# Patient Record
Sex: Male | Born: 1985 | Smoking: Current some day smoker
Health system: Southern US, Community
[De-identification: ages and names within clinical notes are randomized; demographics above are authoritative.]

## PROBLEM LIST (undated history)

## (undated) ENCOUNTER — Ambulatory Visit (HOSPITAL_COMMUNITY): Admission: EM | Payer: Self-pay | Source: Home / Self Care

## (undated) DIAGNOSIS — G4730 Sleep apnea, unspecified: Secondary | ICD-10-CM

## (undated) DIAGNOSIS — I10 Essential (primary) hypertension: Secondary | ICD-10-CM

## (undated) DIAGNOSIS — M109 Gout, unspecified: Secondary | ICD-10-CM

## (undated) DIAGNOSIS — N159 Renal tubulo-interstitial disease, unspecified: Secondary | ICD-10-CM

## (undated) HISTORY — DX: Sleep apnea, unspecified: G47.30

## (undated) HISTORY — DX: Essential (primary) hypertension: I10

---

## 2011-12-24 ENCOUNTER — Ambulatory Visit: Payer: Self-pay

## 2011-12-24 DIAGNOSIS — Z111 Encounter for screening for respiratory tuberculosis: Secondary | ICD-10-CM

## 2013-11-05 ENCOUNTER — Encounter (HOSPITAL_COMMUNITY): Payer: Self-pay | Admitting: Emergency Medicine

## 2013-11-05 ENCOUNTER — Emergency Department (HOSPITAL_COMMUNITY)
Admission: EM | Admit: 2013-11-05 | Discharge: 2013-11-05 | Disposition: A | Discharge status: Home / Self Care | Payer: No Typology Code available for payment source | Attending: Emergency Medicine | Admitting: Emergency Medicine

## 2013-11-05 ENCOUNTER — Emergency Department (HOSPITAL_COMMUNITY): Payer: No Typology Code available for payment source

## 2013-11-05 DIAGNOSIS — M542 Cervicalgia: Secondary | ICD-10-CM

## 2013-11-05 DIAGNOSIS — Y9289 Other specified places as the place of occurrence of the external cause: Secondary | ICD-10-CM | POA: Insufficient documentation

## 2013-11-05 DIAGNOSIS — S199XXA Unspecified injury of neck, initial encounter: Secondary | ICD-10-CM

## 2013-11-05 DIAGNOSIS — M549 Dorsalgia, unspecified: Secondary | ICD-10-CM

## 2013-11-05 DIAGNOSIS — Y9389 Activity, other specified: Secondary | ICD-10-CM | POA: Insufficient documentation

## 2013-11-05 DIAGNOSIS — IMO0002 Reserved for concepts with insufficient information to code with codable children: Secondary | ICD-10-CM | POA: Insufficient documentation

## 2013-11-05 DIAGNOSIS — S0993XA Unspecified injury of face, initial encounter: Secondary | ICD-10-CM | POA: Insufficient documentation

## 2013-11-05 MED ORDER — CYCLOBENZAPRINE HCL 5 MG PO TABS: 5.0000 mg | ORAL_TABLET | Freq: Three times a day (TID) | ORAL | Status: DC | PRN

## 2013-11-05 MED ORDER — IBUPROFEN 800 MG PO TABS: 800.0000 mg | ORAL_TABLET | Freq: Three times a day (TID) | ORAL | Status: DC

## 2013-11-05 MED ORDER — IBUPROFEN 400 MG PO TABS
800.0000 mg | ORAL_TABLET | Freq: Once | ORAL | Status: AC
  Administered 2013-11-05: 800 mg via ORAL
  Filled 2013-11-05: qty 2
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
## 2013-11-09 NOTE — ED Provider Notes (Signed)
Medical screening examination/treatment/procedure(s) were performed by non-physician practitioner and as supervising physician I was immediately available for consultation/collaboration.  Christopher J. Pollina, MD 11/09/13 0758 

## 2015-01-12 IMAGING — CR DG LUMBAR SPINE COMPLETE 4+V
5 series · 5 of 5 positions shown · non-contrast
Comparison: None.

CLINICAL DATA: Pain post trauma

EXAM:
LUMBAR SPINE - COMPLETE 4+ VIEW

[t l-spine a.p.]
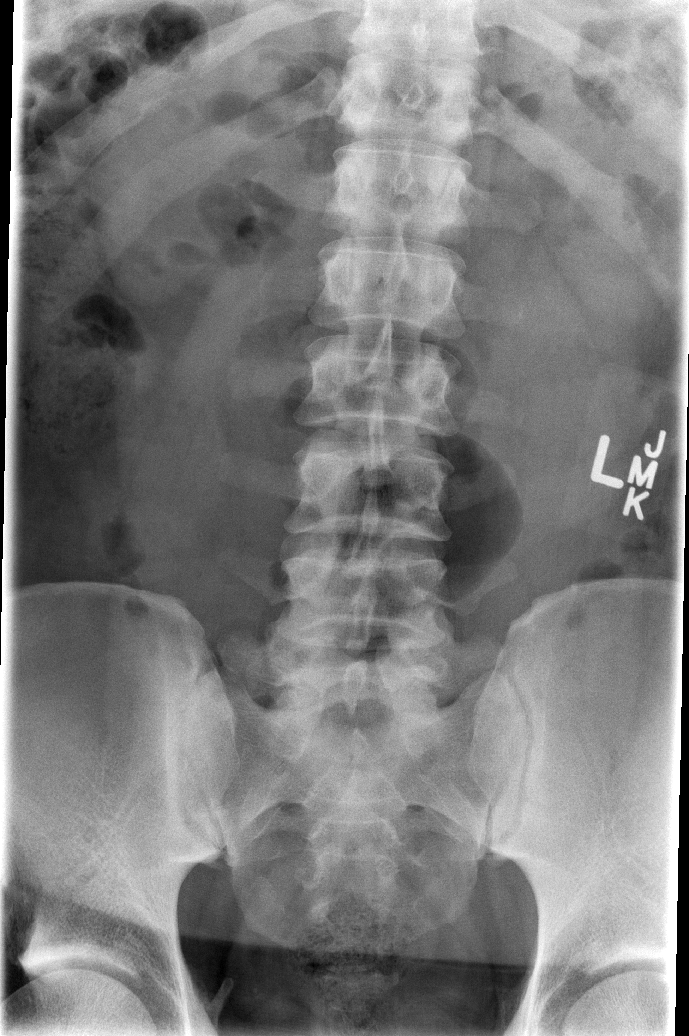

[t l-spine oblique exposure (1 of 2)]
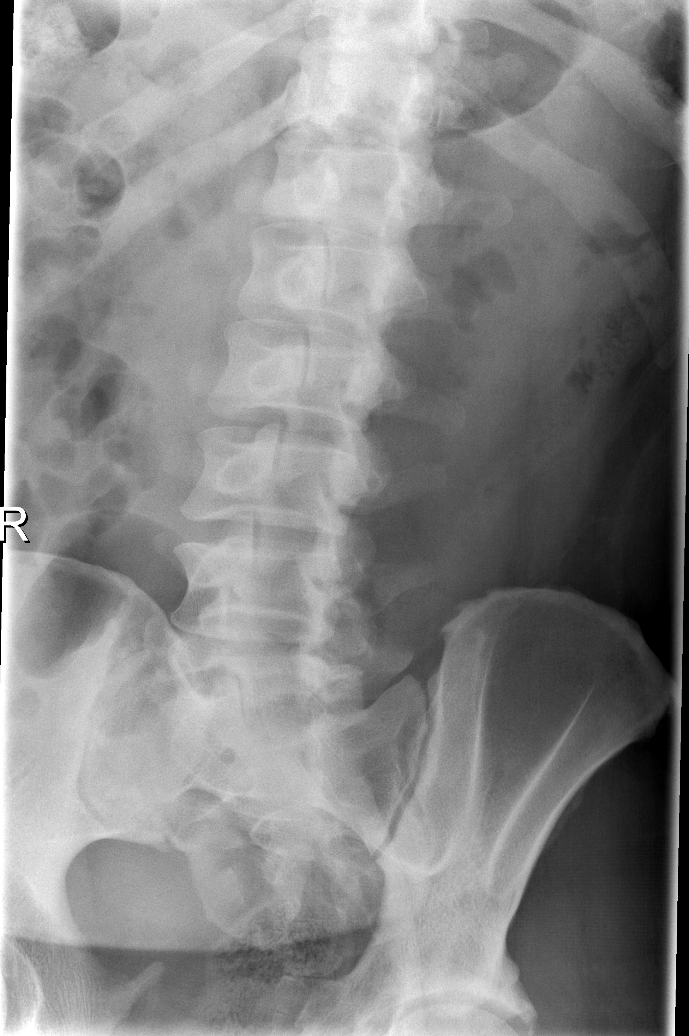

[t l-spine oblique exposure (2 of 2)]
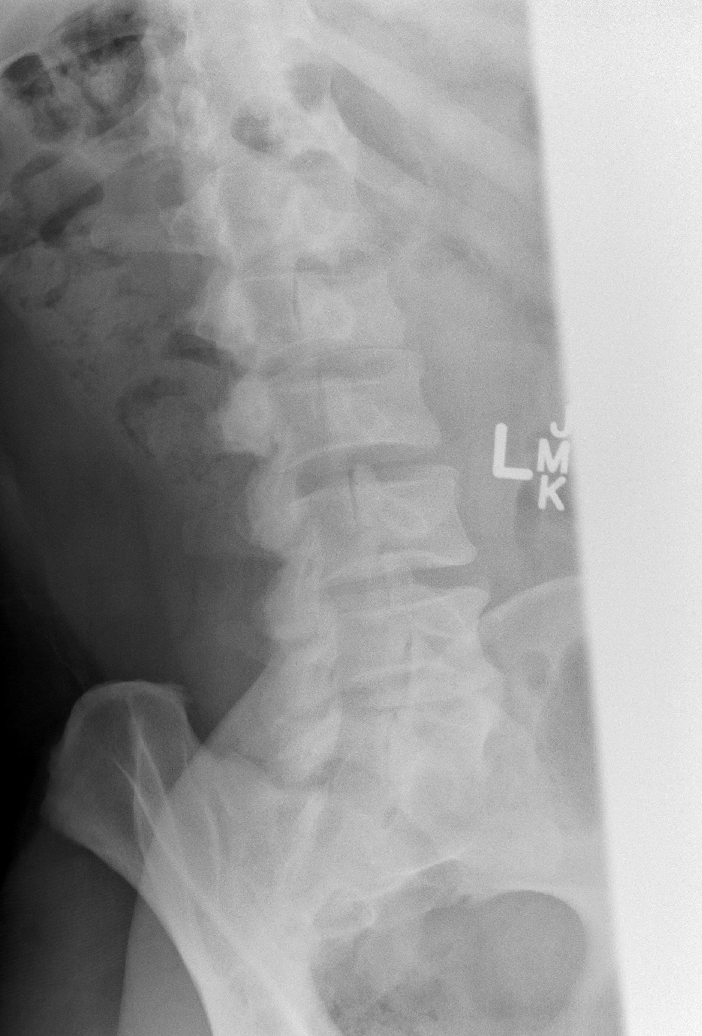

[t l-spine lat]
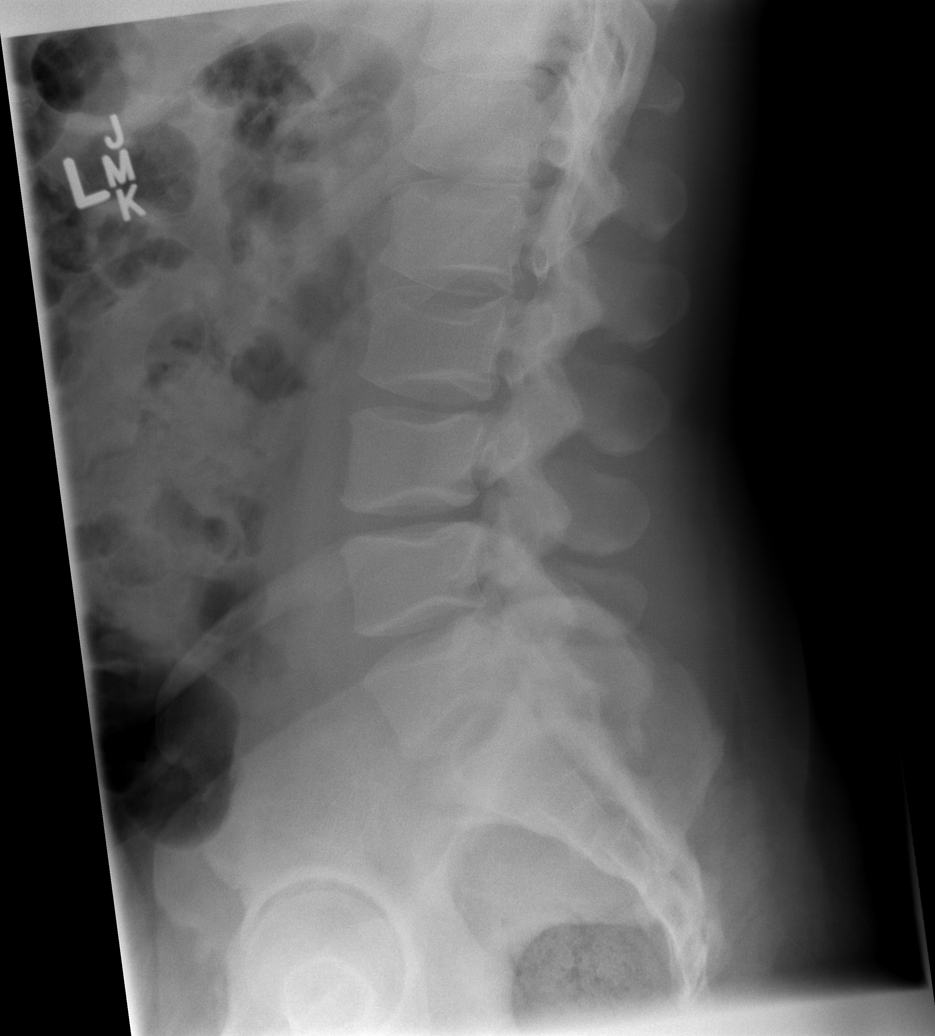

[t l-spine l5-s1 spot]
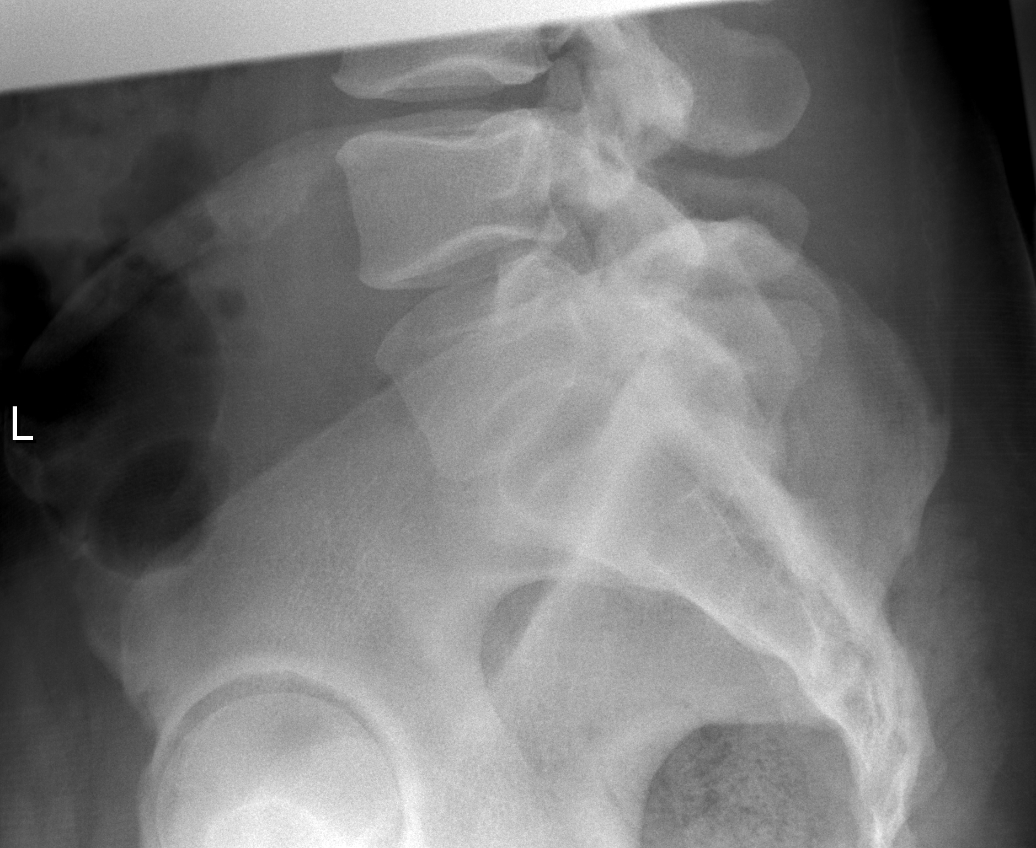

[5 of 5 positions shown; findings below may reference images not displayed]

FINDINGS: Frontal, lateral, spot lumbosacral lateral, and bilateral oblique
views were obtained. There are 6 non-rib-bearing lumbar type
vertebral bodies. There are multiple endplate defects which do not
appear acute. There is no acute appearing fracture or
spondylolisthesis. Disc spaces appear intact. There is no
appreciable facet arthropathy.
IMPRESSION: Multiple endplate defects. Question prior end plate infarcts. No
acute fracture or spondylolisthesis.
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
## 2017-02-11 ENCOUNTER — Ambulatory Visit: Payer: Self-pay | Admitting: Primary Care

## 2017-03-18 ENCOUNTER — Encounter (HOSPITAL_COMMUNITY): Payer: Self-pay

## 2017-03-18 ENCOUNTER — Ambulatory Visit (HOSPITAL_COMMUNITY)
Admission: EM | Admit: 2017-03-18 | Discharge: 2017-03-18 | Disposition: A | Discharge status: Home / Self Care | Payer: 59 | Attending: Internal Medicine | Admitting: Internal Medicine

## 2017-03-18 DIAGNOSIS — L03116 Cellulitis of left lower limb: Secondary | ICD-10-CM | POA: Diagnosis not present

## 2017-03-18 MED ORDER — SULFAMETHOXAZOLE-TRIMETHOPRIM 800-160 MG PO TABS: 1.0000 | ORAL_TABLET | Freq: Two times a day (BID) | ORAL | 0 refills | Status: AC

## 2017-03-18 MED ORDER — IBUPROFEN 800 MG PO TABS: 800.0000 mg | ORAL_TABLET | Freq: Three times a day (TID) | ORAL | 0 refills | Status: DC

## 2017-03-18 NOTE — ED Provider Notes (Signed)
CSN: [id number]     Arrival date & time 03/18/17  1952 History   First MD Initiated Contact with Patient 03/18/17 2054     Chief Complaint  Patient presents with  . Foot Pain   (Consider location/radiation/quality/duration/timing/severity/associated sxs/prior Treatment) Subjective:   Jose Caldwell is a 31 y.o. male who presents for evaluation of of left anterior foot pain. Onset of symptoms was gradual starting 3 days ago with a gradually worsening course since that time. Symptoms include erythema. Patient denies chills or fevers. He is able to ambulate but with some discomfort. There is not a history of trauma to the area. Treatment to date has included none  The following portions of the patient's history were reviewed and updated as appropriate: allergies, current medications, past family history, past medical history, past social history, past surgical history and problem list.         History reviewed. No pertinent past medical history. History reviewed. No pertinent surgical history. No family history on file. Social History  Substance Use Topics  . Smoking status: Never Smoker  . Smokeless tobacco: Never Used  . Alcohol use 1.2 oz/week    2 Standard drinks or equivalent per week    Review of Systems  Constitutional: Negative for fever.  Musculoskeletal:       Right foot redness, swelling and pain   All other systems reviewed and are negative.   Allergies  Patient has no known allergies.  Home Medications   Prior to Admission medications   Medication Sig Start Date End Date Taking? Authorizing Provider  ibuprofen (ADVIL,MOTRIN) 800 MG tablet Take 1 tablet (800 mg total) by mouth 3 (three) times daily. 03/18/17   Lurline Idol, FNP  sulfamethoxazole-trimethoprim (BACTRIM DS,SEPTRA DS) 800-160 MG tablet Take 1 tablet by mouth 2 (two) times daily. 03/18/17 03/23/17  Lurline Idol, FNP   Meds Ordered and Administered this Visit  Medications - No data to  display  BP 140/90 (BP Location: Right Arm)   Pulse (!) 101   Temp 98.4 F (36.9 C) (Oral)   Resp 20   SpO2 99%  No data found.   Physical Exam  Constitutional: He is oriented to person, place, and time. He appears well-developed and well-nourished. No distress.  Cardiovascular: Normal rate and regular rhythm.   Pulmonary/Chest: Effort normal and breath sounds normal.  Musculoskeletal: He exhibits tenderness. He exhibits no deformity.  Right anterior foot near base of great toe with redness, warmth and tenderness extending up the foot and out towards the base of the 2nd-4th toes without any streaking. Pulses intact. No great toe swelling or joint tenderness   Neurological: He is alert and oriented to person, place, and time.  Skin: Skin is warm and dry.  Psychiatric: He has a normal mood and affect.    Urgent Care Course     Procedures (including critical care time)  Labs Review Labs Reviewed - No data to display  Imaging Review No results found.   Visual Acuity Review  Right Eye Distance:   Left Eye Distance:   Bilateral Distance:    Right Eye Near:   Left Eye Near:    Bilateral Near:         MDM   1. Cellulitis of left lower extremity    Antibiotics given (bactrim BID x 5 days)  Ibuprofen 800 mg TID as needed for pain  Strict return precautions discussed  Follow-up as needed   Discussed diagnosis and treatment with patient. All questions have been  answered and all concerns have been addressed. The patient verbalized understanding and had no further questions      Lurline IdolMurrill, Lennard Capek, OregonFNP 03/18/17 2109

## 2017-03-18 NOTE — ED Triage Notes (Signed)
Pt said he woke up 3 days ago and said he was having mild pain and now today it's swollen and throbbing with extreme pain. Pt did mention he was diagnosed with gout a few years ago but never got the medication filled and never had a problem since then. No otc meds tried.

## 2017-11-23 DIAGNOSIS — Z719 Counseling, unspecified: Secondary | ICD-10-CM | POA: Diagnosis not present

## 2018-03-01 ENCOUNTER — Other Ambulatory Visit: Payer: Self-pay

## 2018-03-01 ENCOUNTER — Ambulatory Visit (HOSPITAL_COMMUNITY)
Admission: EM | Admit: 2018-03-01 | Discharge: 2018-03-01 | Disposition: A | Discharge status: Home / Self Care | Payer: 59 | Attending: Family Medicine | Admitting: Family Medicine

## 2018-03-01 ENCOUNTER — Encounter (HOSPITAL_COMMUNITY): Payer: Self-pay | Admitting: Emergency Medicine

## 2018-03-01 DIAGNOSIS — M25562 Pain in left knee: Secondary | ICD-10-CM

## 2018-03-01 DIAGNOSIS — M7652 Patellar tendinitis, left knee: Secondary | ICD-10-CM | POA: Diagnosis not present

## 2018-03-01 MED ORDER — HYDROCODONE-ACETAMINOPHEN 7.5-325 MG PO TABS: 1.0000 | ORAL_TABLET | Freq: Four times a day (QID) | ORAL | 0 refills | Status: DC | PRN

## 2018-03-01 MED ORDER — METHYLPREDNISOLONE 4 MG PO TBPK: ORAL_TABLET | ORAL | 0 refills | Status: DC

## 2018-03-01 MED ORDER — IBUPROFEN 800 MG PO TABS: 800.0000 mg | ORAL_TABLET | Freq: Three times a day (TID) | ORAL | 0 refills | Status: DC

## 2018-03-01 NOTE — ED Triage Notes (Signed)
Left knee started aching, pain has worsened since then.  While sitting, unable to extend left leg- too painful.  No known injury

## 2018-03-01 NOTE — Discharge Instructions (Addendum)
Ice 20 min, 2-3 X a day Rest Medrol dosepak as directed Take all of day one today Restrict ambulation to only as needed After prednisone may take ibuprofen Pain medicine as needed Caution drowsiness and constipation See ortho if not improving by Monday

## 2018-03-01 NOTE — ED Provider Notes (Signed)
[facility]    CSN: [id number] Arrival date & time: 03/01/18  1231     History   Chief Complaint Chief Complaint  Patient presents with  . Knee Pain    HPI Jose Caldwell is a 32 y.o. male.   HPI  Knee pain since Sunday Noted pain while driving - mild Worse when stood our of truck Monday was more severe By today cannot comfortably bear weight. Patient denies any accident injury or overuse. He did not do any new activities or kneeling. He is not athletic at this time although was in his youth. He is never had any problems.  Does not recall any x-rays. He took some over-the-counter medicines with no improvement.   History reviewed. No pertinent past medical history.  There are no active problems to display for this patient.   History reviewed. No pertinent surgical history.     Home Medications    Prior to Admission medications   Medication Sig Start Date End Date Taking? Authorizing Provider  HYDROcodone-acetaminophen (NORCO) 7.5-325 MG tablet Take 1 tablet by mouth every 6 (six) hours as needed for moderate pain. 03/01/18   Eustace Moore, MD  ibuprofen (ADVIL,MOTRIN) 800 MG tablet Take 1 tablet (800 mg total) by mouth 3 (three) times daily. 03/01/18   Eustace Moore, MD  methylPREDNISolone (MEDROL DOSEPAK) 4 MG TBPK tablet tad 03/01/18   Eustace Moore, MD    Family History Family History  Problem Relation Age of Onset  . Healthy Mother     Social History Social History   Tobacco Use  . Smoking status: Current Every Day Smoker    Types: Cigars  . Smokeless tobacco: Never Used  Substance Use Topics  . Alcohol use: Yes    Alcohol/week: 1.2 oz    Types: 2 Standard drinks or equivalent per week  . Drug use: Never     Allergies   Patient has no known allergies.   Review of Systems Review of Systems  Constitutional: Negative for chills and fever.  HENT: Negative for ear pain and sore throat.   Eyes: Negative for pain and  visual disturbance.  Respiratory: Negative for cough and shortness of breath.   Cardiovascular: Negative for chest pain and palpitations.  Gastrointestinal: Negative for abdominal pain and vomiting.  Genitourinary: Negative for dysuria and hematuria.  Musculoskeletal: Negative for arthralgias and back pain.  Skin: Negative for color change and rash.  Neurological: Negative for seizures and syncope.  All other systems reviewed and are negative.    Physical Exam Triage Vital Signs ED Triage Vitals  Enc Vitals Group     BP 03/01/18 1320 (!) 144/99     Pulse Rate 03/01/18 1320 89     Resp 03/01/18 1320 (!) 22     Temp 03/01/18 1320 98.7 F (37.1 C)     Temp Source 03/01/18 1320 Oral     SpO2 03/01/18 1320 100 %     Weight --      Height --      Head Circumference --      Peak Flow --      Pain Score 03/01/18 1317 9     Pain Loc --      Pain Edu? --      Excl. in GC? --    No data found.  Updated Vital Signs BP (!) 144/99 (BP Location: Right Arm) Comment (BP Location): large cuff  Pulse 89   Temp 98.7 F (37.1 C) (Oral)  Resp (!) 22   SpO2 100%       Physical Exam  Constitutional: He appears well-developed and well-nourished. He appears distressed.  Obvious pain  HENT:  Head: Normocephalic and atraumatic.  Eyes: Pupils are equal, round, and reactive to light. Conjunctivae are normal.  Neck: Normal range of motion. Neck supple.  Cardiovascular: Normal rate and regular rhythm.  No murmur heard. Pulmonary/Chest: Effort normal and breath sounds normal. No respiratory distress.  Abdominal: Soft. There is no tenderness.  Musculoskeletal: He exhibits no edema.       Right knee: Normal.       Left knee: He exhibits decreased range of motion, swelling and erythema. Tenderness found. Patellar tendon tenderness noted.  From a seated position the patient is able to extend his left leg no more than 6 inches.  I can passively extend leg. No joint line tenderness.  No  effusion.  No instability to examination. This redness warmth and tenderness from the upper pole of the patella down to the tibial tuberosity. No fluid in the prepatellar bursa  Neurological: He is alert.  Skin: Skin is warm and dry.  Psychiatric: He has a normal mood and affect.  Nursing note and vitals reviewed.    UC Treatments / Results  L  Initial Impression / Assessment and Plan / UC Course  I have reviewed the triage vital signs and the nursing notes.  Pertinent labs & imaging results that were available during my care of the patient were reviewed by me and considered in my medical decision making (see chart for details).     Discussed with patient that it does not appear that he has any internal derangement in his knee.  He has anterior knee pain from an inflammatory response in his patellar tendon.  He must have had unknown trauma or stress to this area.  X-rays would not be contributory.  Conservative care discussed. Final Clinical Impressions(s) / UC Diagnoses   Final diagnoses:  Patellar tendinitis of left knee  Knee pain, left anterior     Discharge Instructions     Ice 20 min, 2-3 X a day Rest Medrol dosepak as directed Take all of day one today Restrict ambulation to only as needed After prednisone may take ibuprofen Pain medicine as needed Caution drowsiness and constipation See ortho if not improving by Monday   ED Prescriptions    Medication Sig Dispense Auth. Provider   ibuprofen (ADVIL,MOTRIN) 800 MG tablet Take 1 tablet (800 mg total) by mouth 3 (three) times daily. 30 tablet Eustace Moore, MD   methylPREDNISolone (MEDROL DOSEPAK) 4 MG TBPK tablet tad 21 tablet Eustace Moore, MD   HYDROcodone-acetaminophen [facility]) 7.5-325 MG tablet Take 1 tablet by mouth every 6 (six) hours as needed for moderate pain. 15 tablet Eustace Moore, MD     Controlled Substance Prescriptions [place] Controlled Substance Registry consulted? No prior narcotics    Eustace Moore, MD 03/01/18 [phone]

## 2018-03-15 ENCOUNTER — Ambulatory Visit (HOSPITAL_COMMUNITY)
Admission: EM | Admit: 2018-03-15 | Discharge: 2018-03-15 | Disposition: A | Discharge status: Home / Self Care | Payer: 59 | Attending: Family Medicine | Admitting: Family Medicine

## 2018-03-15 ENCOUNTER — Encounter (HOSPITAL_COMMUNITY): Payer: Self-pay | Admitting: Emergency Medicine

## 2018-03-15 ENCOUNTER — Other Ambulatory Visit: Payer: Self-pay

## 2018-03-15 DIAGNOSIS — M109 Gout, unspecified: Secondary | ICD-10-CM | POA: Diagnosis not present

## 2018-03-15 MED ORDER — COLCHICINE 0.6 MG PO TABS: ORAL_TABLET | ORAL | 0 refills | Status: DC

## 2018-03-15 NOTE — ED Triage Notes (Signed)
The patient presented to the [facility] with a complaint of left foot pain. The patient denied any injury but reported a hx of gout.

## 2018-03-15 NOTE — ED Provider Notes (Signed)
[facility]   [id number] 03/15/18 Arrival Time: 1106  ASSESSMENT & PLAN:  1. Acute gout involving toe of left foot, unspecified cause    Meds ordered this encounter  Medications  . colchicine 0.6 MG tablet    Sig: Take two tablets as one dose followed one hour later by one tablet. No more than 3 tablets every 24 hours.    Dispense:  6 tablet    Refill:  0   Ibuprofen with food as needed. Will f/u here if not improving over the next couple of days.  Reviewed expectations re: course of current medical issues. Questions answered. Outlined signs and symptoms indicating need for more acute intervention. Patient verbalized understanding. After Visit Summary given.  SUBJECTIVE: History from: patient. Jose Caldwell is a 32 y.o. male who reports persistent moderate pain of his left first MTP joint/toe that is gradually worsening; described as aching and stabbing without radiation. Onset: abrupt, several days ago. Injury/trama: no. Relieved by: nothing in particular. Worsened by: certain movements. Associated symptoms: none reported. Extremity sensation changes or weakness: none. Self treatment: ibuprofen with only mild help. History of similar: yes, feels like gout he has been diagnosed with in the past. No new medications reported.  ROS: As per HPI.   OBJECTIVE:  Vitals:   03/15/18 1114  BP: 132/83  Pulse: (!) 104  Resp: 20  Temp: 97.7 F (36.5 C)  TempSrc: Oral  SpO2: 100%    General appearance: alert; no distress Extremities: warm and well perfused; symmetrical with no gross deformities; localized tenderness over his left first MTP joint with mild swelling and no bruising; mild erythema; ROM: normal but with pain reported CV: normal extremity capillary refill Skin: warm and dry Neurologic: normal gait; normal symmetric reflexes in all extremities; normal sensation in all extremities Psychological: alert and cooperative; normal mood and affect  No Known  Allergies   Social History   Socioeconomic History  . Marital status: Unknown    Spouse name: Not on file  . Number of children: Not on file  . Years of education: Not on file  . Highest education level: Not on file  Occupational History  . Not on file  Social Needs  . Financial resource strain: Not on file  . Food insecurity:    Worry: Not on file    Inability: Not on file  . Transportation needs:    Medical: Not on file    Non-medical: Not on file  Tobacco Use  . Smoking status: Current Every Day Smoker    Types: Cigars  . Smokeless tobacco: Never Used  Substance and Sexual Activity  . Alcohol use: Yes    Alcohol/week: 1.2 oz    Types: 2 Standard drinks or equivalent per week  . Drug use: Never  . Sexual activity: Not on file  Lifestyle  . Physical activity:    Days per week: Not on file    Minutes per session: Not on file  . Stress: Not on file  Relationships  . Social connections:    Talks on phone: Not on file    Gets together: Not on file    Attends religious service: Not on file    Active member of club or organization: Not on file    Attends meetings of clubs or organizations: Not on file    Relationship status: Not on file  . Intimate partner violence:    Fear of current or ex partner: Not on file    Emotionally abused:  Not on file    Physically abused: Not on file    Forced sexual activity: Not on file  Other Topics Concern  . Not on file  Social History Narrative  . Not on file   Family History  Problem Relation Age of Onset  . Healthy Mother    History reviewed. No pertinent surgical history.    Mardella Layman, MD 03/15/18 1209

## 2018-09-15 ENCOUNTER — Ambulatory Visit (HOSPITAL_COMMUNITY)
Admission: EM | Admit: 2018-09-15 | Discharge: 2018-09-15 | Disposition: A | Discharge status: Home / Self Care | Payer: 59 | Attending: Family Medicine | Admitting: Family Medicine

## 2018-09-15 ENCOUNTER — Encounter (HOSPITAL_COMMUNITY): Payer: Self-pay | Admitting: Emergency Medicine

## 2018-09-15 DIAGNOSIS — M109 Gout, unspecified: Secondary | ICD-10-CM

## 2018-09-15 HISTORY — DX: Gout, unspecified: M10.9

## 2018-09-15 MED ORDER — HYDROCODONE-ACETAMINOPHEN 7.5-325 MG PO TABS: 1.0000 | ORAL_TABLET | Freq: Four times a day (QID) | ORAL | 0 refills | Status: DC | PRN

## 2018-09-15 MED ORDER — COLCHICINE 0.6 MG PO TABS: ORAL_TABLET | ORAL | 3 refills | Status: DC

## 2018-09-15 NOTE — Discharge Instructions (Signed)
Take the colchicine as directed I have given you refills Repeat at first sign of gout Take pain medicine as needed Do not drive on the [place]

## 2018-09-15 NOTE — ED Triage Notes (Signed)
Pt c/o Gout in his R foot, states hes ran out of his gout medicine and needs a refill.

## 2018-09-15 NOTE — ED Provider Notes (Signed)
[facility]    CSN: [id number] Arrival date & time: 09/15/18  [id number]     History   Chief Complaint Chief Complaint  Patient presents with  . Gout  . Foot Pain    HPI Jose Caldwell is a 32 y.o. male.   HPI  Patient has recurrence of gout in his right foot.  He has had gout before.  He recognizes signs.  He knows that diet.  He does not drink alcohol.  He tries to keep up with his fluids.  He does not have a PCP.  He does not have a way to get his medicine refilled.  He is here today requesting colchicine.  He has not had any trauma. We did discuss that it is preferable to see PCP on a regular basis.  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  Past Medical History:  Diagnosis Date  . Gout     There are no active problems to display for this patient.   History reviewed. No pertinent surgical history.     Home Medications    Prior to Admission medications   Medication Sig Start Date End Date Taking? Authorizing Provider  colchicine 0.6 MG tablet Take two tablets as one dose followed one hour later by one tablet. No more than 3 tablets every 24 hours. 09/15/18   Eustace MooreNelson, Kito Cuffe Sue, MD  HYDROcodone-acetaminophen (NORCO) 7.5-325 MG tablet Take 1 tablet by mouth every 6 (six) hours as needed for moderate pain. 09/15/18   Eustace MooreNelson, Mehreen Azizi Sue, MD  ibuprofen (ADVIL,MOTRIN) 800 MG tablet Take 1 tablet (800 mg total) by mouth 3 (three) times daily. 03/01/18   Eustace MooreNelson, Noam Karaffa Sue, MD    Family History Family History  Problem Relation Age of Onset  . Healthy Mother     Social History Social History   Tobacco Use  . Smoking status: Current Every Day Smoker    Types: Cigars  . Smokeless tobacco: Never Used  Substance Use Topics  . Alcohol use: Yes    Alcohol/week: 2.0 standard drinks    Types: 2 Standard drinks or equivalent per week  . Drug use: Never     Allergies   Patient has no known allergies.   Review of Systems Review of Systems    Constitutional: Negative for chills and fever.  HENT: Negative for ear pain and sore throat.   Eyes: Negative for pain and visual disturbance.  Respiratory: Negative for cough and shortness of breath.   Cardiovascular: Negative for chest pain and palpitations.  Gastrointestinal: Negative for abdominal pain and vomiting.  Genitourinary: Negative for dysuria and hematuria.  Musculoskeletal: Positive for arthralgias and gait problem. Negative for back pain.  Skin: Negative for color change and rash.  Neurological: Negative for seizures and syncope.  All other systems reviewed and are negative.    Physical Exam Triage Vital Signs ED Triage Vitals [09/15/18 1044]  Enc Vitals Group     BP 120/68     Pulse Rate 77     Resp 16     Temp 98 F (36.7 C)     Temp src      SpO2 100 %     Weight      Height      Head Circumference      Peak Flow      Pain Score 7     Pain Loc      Pain Edu?      Excl. in GC?  No data found.  Updated Vital Signs BP 120/68   Pulse 77   Temp 98 F (36.7 C)   Resp 16   SpO2 100%   Visual Acuity Right Eye Distance:   Left Eye Distance:   Bilateral Distance:    Right Eye Near:   Left Eye Near:    Bilateral Near:     Physical Exam  Constitutional: He appears well-developed and well-nourished. No distress.  HENT:  Head: Normocephalic and atraumatic.  Mouth/Throat: Oropharynx is clear and moist.  Eyes: Pupils are equal, round, and reactive to light. Conjunctivae are normal.  Neck: Normal range of motion.  Cardiovascular: Normal rate, regular rhythm and normal heart sounds.  Pulmonary/Chest: Effort normal and breath sounds normal. No respiratory distress.  Abdominal: Soft. He exhibits no distension.  Musculoskeletal: Normal range of motion. He exhibits no edema.  Great toe on the right foot has redness, swelling, tenderness, warmth around the MTP joint  Neurological: He is alert.  Skin: Skin is warm and dry.  Psychiatric: He has a  normal mood and affect. His behavior is normal.     UC Treatments / Results  Labs (all labs ordered are listed, but only abnormal results are displayed) Labs Reviewed - No data to display  EKG None  Radiology No results found.  Procedures Procedures (including critical care time)  Medications Ordered in UC Medications - No data to display  Initial Impression / Assessment and Plan / UC Course  I have reviewed the triage vital signs and the nursing notes.  Pertinent labs & imaging results that were available during my care of the patient were reviewed by me and considered in my medical decision making (see chart for details).      Final Clinical Impressions(s) / UC Diagnoses   Final diagnoses:  Acute gout involving toe of right foot, unspecified cause     Discharge Instructions     Take the colchicine as directed I have given you refills Repeat at first sign of gout Take pain medicine as needed Do not drive on the [place]   ED Prescriptions    Medication Sig Dispense Auth. Provider   colchicine 0.6 MG tablet Take two tablets as one dose followed one hour later by one tablet. No more than 3 tablets every 24 hours. 6 tablet Eustace Moore, MD   HYDROcodone-acetaminophen [facility]) 7.5-325 MG tablet Take 1 tablet by mouth every 6 (six) hours as needed for moderate pain. 10 tablet Eustace Moore, MD     Controlled Substance Prescriptions [place] Controlled Substance Registry consulted? Yes, I have consulted the  Controlled Substances Registry for this patient, and feel the risk/benefit ratio today is favorable for proceeding with this prescription for a controlled substance.   Eustace Moore, MD 09/15/18 [phone]

## 2019-06-25 ENCOUNTER — Encounter (HOSPITAL_COMMUNITY): Payer: Self-pay

## 2019-06-25 ENCOUNTER — Other Ambulatory Visit: Payer: Self-pay

## 2019-06-25 ENCOUNTER — Ambulatory Visit (HOSPITAL_COMMUNITY)
Admission: EM | Admit: 2019-06-25 | Discharge: 2019-06-25 | Disposition: A | Discharge status: Home / Self Care | Payer: 59 | Attending: Family Medicine | Admitting: Family Medicine

## 2019-06-25 DIAGNOSIS — G5621 Lesion of ulnar nerve, right upper limb: Secondary | ICD-10-CM | POA: Diagnosis not present

## 2019-06-25 LAB — GLUCOSE, CAPILLARY: Glucose-Capillary: 78 mg/dL (ref 70–99)

## 2019-06-25 MED ORDER — IBUPROFEN 800 MG PO TABS: 800.0000 mg | ORAL_TABLET | Freq: Three times a day (TID) | ORAL | 0 refills | Status: DC

## 2019-06-25 MED ORDER — PREDNISONE 10 MG (21) PO TBPK: ORAL_TABLET | Freq: Every day | ORAL | 0 refills | Status: DC

## 2019-06-25 NOTE — ED Triage Notes (Signed)
Patient presents to Urgent Care with complaints of medial right hand numbness  And intermittent pai since 5 days ago. 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

## 2019-06-25 NOTE — ED Provider Notes (Addendum)
[facility]    CSN: [id number] Arrival date & time: 06/25/19  1020      History   Chief Complaint Chief Complaint  Patient presents with  . hand numbness  . Gout    HPI Jose Caldwell is a 33 y.o. male.   HPI  he is a healthy 33 year old.  Rarely has gout.  Is here today for hand numbness.  He has some numbness in the right hand middle ring and small fingers.  Initially would come and go.  He states that it would at times be present and at other times it would just go away spontaneously.  He states that for the last 5 days it is been present all the time.  He states that initially it was a minor tingling.  Over time it is become more densely numb.  He can move the fingers normally.  He can move his hand normally.  No difficulty with driving.  No pain at night.  No injury or pain to the neck.  No injury or pain to the elbow or arm.  Good grip strength.  No other areas of numbness.  No history of diabetes.  Past Medical History:  Diagnosis Date  . Gout     There are no active problems to display for this patient.   History reviewed. No pertinent surgical history.     Home Medications    Prior to Admission medications   Medication Sig Start Date End Date Taking? Authorizing Provider  colchicine 0.6 MG tablet Take two tablets as one dose followed one hour later by one tablet. No more than 3 tablets every 24 hours. 09/15/18   Eustace MooreNelson, Yarithza Mink Sue, MD  ibuprofen (ADVIL) 800 MG tablet Take 1 tablet (800 mg total) by mouth 3 (three) times daily. 06/25/19   Eustace MooreNelson, Artemisa Sladek Sue, MD  predniSONE (STERAPRED UNI-PAK 21 TAB) 10 MG (21) TBPK tablet Take by mouth daily. Take 6 tabs by mouth daily  for 2 days, then 5 tabs for 2 days, then 4 tabs for 2 days, then 3 tabs for 2 days, 2 tabs for 2 days, then 1 tab by mouth daily for 2 days 06/25/19   Eustace MooreNelson, Juliah Scadden Sue, MD    Family History Family History  Problem Relation Age of Onset  . Healthy Mother   . Healthy Father      Social History Social History   Tobacco Use  . Smoking status: Current Every Day Smoker    Types: Cigars  . Smokeless tobacco: Never Used  Substance Use Topics  . Alcohol use: Yes    Alcohol/week: 2.0 standard drinks    Types: 2 Standard drinks or equivalent per week  . Drug use: Never     Allergies   Patient has no known allergies.   Review of Systems Review of Systems  Constitutional: Negative for chills and fever.  HENT: Negative for ear pain and sore throat.   Eyes: Negative for pain and visual disturbance.  Respiratory: Negative for cough and shortness of breath.   Cardiovascular: Negative for chest pain and palpitations.  Gastrointestinal: Negative for abdominal pain and vomiting.  Genitourinary: Negative for dysuria and hematuria.  Musculoskeletal: Negative for arthralgias and back pain.  Skin: Negative for color change and rash.  Neurological: Positive for numbness. Negative for seizures and syncope.  All other systems reviewed and are negative.    Physical Exam Triage Vital Signs ED Triage Vitals  Enc Vitals Group     BP 06/25/19 1143 Marland Kitchen(!)  132/96     Pulse Rate 06/25/19 1143 72     Resp 06/25/19 1143 15     Temp 06/25/19 1143 98.2 F (36.8 C)     Temp Source 06/25/19 1143 Temporal     SpO2 06/25/19 1143 100 %     Weight --      Height --      Head Circumference --      Peak Flow --      Pain Score 06/25/19 1141 0     Pain Loc --      Pain Edu? --      Excl. in [place]? --    No data found.  Updated Vital Signs BP (!) 132/96 (BP Location: Left Arm)   Pulse 72   Temp 98.2 F (36.8 C) (Temporal)   Resp 15   SpO2 100%      Physical Exam Constitutional:      General: He is not in acute distress.    Appearance: He is well-developed. He is obese.  HENT:     Head: Normocephalic and atraumatic.  Eyes:     Conjunctiva/sclera: Conjunctivae normal.     Pupils: Pupils are equal, round, and reactive to light.  Neck:     Musculoskeletal: Normal range  of motion.     Comments: Neck is full range of motion.  No tenderness.  Negative Spurling's Cardiovascular:     Rate and Rhythm: Normal rate.  Pulmonary:     Effort: Pulmonary effort is normal. No respiratory distress.  Abdominal:     General: There is no distension.     Palpations: Abdomen is soft.  Musculoskeletal: Normal range of motion.     Comments: Both upper extremities have normal strength sensation range of motion and reflexes.  There is no tenderness to palpation of the cubital tunnel  Skin:    General: Skin is warm and dry.  Neurological:     Mental Status: He is alert.  Psychiatric:        Mood and Affect: Mood normal.        Behavior: Behavior normal.      UC Treatments / Results  Labs (all labs ordered are listed, but only abnormal results are displayed) Labs Reviewed  GLUCOSE, CAPILLARY  CBG MONITORING, ED    EKG   Radiology No results found.  Procedures Procedures (including critical care time)  Medications Ordered in UC Medications - No data to display  Initial Impression / Assessment and Plan / UC Course  I have reviewed the triage vital signs and the nursing notes.  Pertinent labs & imaging results that were available during my care of the patient were reviewed by me and considered in my medical decision making (see chart for details).     Discussed that he had some sort of nerve impingement syndrome.  Probably elbow.  Possibly neck.  Number to treat him with anti-inflammatories.  If this worsens or fails to improve he will need to see his primary care doctor for referral to orthopedics Final Clinical Impressions(s) / UC Diagnoses   Final diagnoses:  Cubital tunnel syndrome on right     Discharge Instructions     Take the prednisone pack as directed.  Take all of day 1 today.  Tomorrow take it on schedule When you are all done with the prednisone you may start ibuprofen 3 times a day with food.  This is also an anti-inflammatory. I have  refilled your gout medicine.  Take it as  needed. Will be good for you to have a primary care doctor for your ongoing medical needs.  I have given you the name of one taking new patients   ED Prescriptions    Medication Sig Dispense Auth. Provider   predniSONE (STERAPRED UNI-PAK 21 TAB) 10 MG (21) TBPK tablet Take by mouth daily. Take 6 tabs by mouth daily  for 2 days, then 5 tabs for 2 days, then 4 tabs for 2 days, then 3 tabs for 2 days, 2 tabs for 2 days, then 1 tab by mouth daily for 2 days 42 tablet Eustace MooreNelson, Malori Myers Sue, MD   ibuprofen (ADVIL) 800 MG tablet Take 1 tablet (800 mg total) by mouth 3 (three) times daily. 30 tablet Eustace MooreNelson, Wilfrido Luedke Sue, MD     Controlled Substance Prescriptions [place] Controlled Substance Registry consulted? No   Eustace MooreNelson, Kaymarie Wynn Sue, MD 06/25/19 1345    Eustace MooreNelson, Jacksyn Beeks Sue, MD 06/25/19 1346

## 2019-06-25 NOTE — Discharge Instructions (Signed)
Take the prednisone pack as directed.  Take all of day 1 today.  Tomorrow take it on schedule When you are all done with the prednisone you may start ibuprofen 3 times a day with food.  This is also an anti-inflammatory. I have refilled your gout medicine.  Take it as needed. Will be good for you to have a primary care doctor for your ongoing medical needs.  I have given you the name of one taking new patients

## 2020-01-28 ENCOUNTER — Ambulatory Visit: Payer: 59 | Attending: Internal Medicine

## 2021-02-27 ENCOUNTER — Encounter: Payer: Self-pay | Admitting: Emergency Medicine

## 2021-02-27 ENCOUNTER — Ambulatory Visit
Admission: EM | Admit: 2021-02-27 | Discharge: 2021-02-27 | Disposition: A | Discharge status: Home / Self Care | Payer: 59 | Attending: Emergency Medicine | Admitting: Emergency Medicine

## 2021-02-27 ENCOUNTER — Other Ambulatory Visit: Payer: Self-pay

## 2021-02-27 DIAGNOSIS — R200 Anesthesia of skin: Secondary | ICD-10-CM | POA: Diagnosis not present

## 2021-02-27 MED ORDER — TIZANIDINE HCL 2 MG PO TABS: 2.0000 mg | ORAL_TABLET | Freq: Four times a day (QID) | ORAL | 0 refills | Status: DC | PRN

## 2021-02-27 MED ORDER — PREDNISONE 10 MG PO TABS: ORAL_TABLET | ORAL | 0 refills | Status: DC

## 2021-02-27 NOTE — ED Provider Notes (Signed)
[facility]    CSN: [id number] Arrival date & time: 02/27/21  1248      History   Chief Complaint Chief Complaint  Patient presents with  . Numbness    HPI Jose Caldwell is a 35 y.o. male presenting today for evaluation of index finger numbness.  Reports had a fall approximately 1 week ago sustaining abrasions underneath his fourth and fifth fingers on his left hand.  Denies any pain or limitations in movement after the fall.  Over the past few days has developed numbness to his left index finger.  Reports numbness is on palmar surface as well as dorsum of hand, denies limitation of motion or any pain.  Reports numbness tingling sensation.  Also reports he cracked his neck a few days ago which he is concerned may be contributing to his numbness sensation as well.  Denies numbness tingling throughout his arm, will occasionally have some discomfort in her shoulder with lying on it at night.  HPI  Past Medical History:  Diagnosis Date  . Gout     There are no problems to display for this patient.   History reviewed. No pertinent surgical history.     Home Medications    Prior to Admission medications   Medication Sig Start Date End Date Taking? Authorizing Provider  predniSONE (DELTASONE) 10 MG tablet Begin with 6 tabs on day 1, 5 tab on day 2, 4 tab on day 3, 3 tab on day 4, 2 tab on day 5, 1 tab on day 6-take with food 02/27/21  Yes Adrin Julian C, PA-C  tiZANidine (ZANAFLEX) 2 MG tablet Take 1-2 tablets (2-4 mg total) by mouth every 6 (six) hours as needed for muscle spasms. 02/27/21  Yes Christpoher Sievers C, PA-C  colchicine 0.6 MG tablet Take two tablets as one dose followed one hour later by one tablet. No more than 3 tablets every 24 hours. 09/15/18 02/27/21  Eustace Moore, MD    Family History Family History  Problem Relation Age of Onset  . Healthy Mother   . Healthy Father     Social History Social History   Tobacco Use  . Smoking status:  Current Some Day Smoker    Types: Cigars  . Smokeless tobacco: Never Used  Vaping Use  . Vaping Use: Never used  Substance Use Topics  . Alcohol use: Yes    Alcohol/week: 2.0 standard drinks    Types: 2 Standard drinks or equivalent per week  . Drug use: Never     Allergies   Patient has no known allergies.   Review of Systems Review of Systems  Constitutional: Negative for fatigue and fever.  Eyes: Negative for redness, itching and visual disturbance.  Respiratory: Negative for shortness of breath.   Cardiovascular: Negative for chest pain and leg swelling.  Gastrointestinal: Negative for nausea and vomiting.  Musculoskeletal: Negative for arthralgias and myalgias.  Skin: Negative for color change, rash and wound.  Neurological: Positive for numbness. Negative for dizziness, syncope, weakness, light-headedness and headaches.     Physical Exam Triage Vital Signs ED Triage Vitals  Enc Vitals Group     BP      Pulse      Resp      Temp      Temp src      SpO2      Weight      Height      Head Circumference      Peak Flow  Pain Score      Pain Loc      Pain Edu?      Excl. in GC?    No data found.  Updated Vital Signs BP (!) 145/98 (BP Location: Left Arm)   Pulse 89   Temp 98.3 F (36.8 C) (Oral)   Resp 13   Ht 6\' 3"  (1.905 m)   Wt (!) 322 lb (146.1 kg)   SpO2 98%   BMI 40.25 kg/m   Visual Acuity Right Eye Distance:   Left Eye Distance:   Bilateral Distance:    Right Eye Near:   Left Eye Near:    Bilateral Near:     Physical Exam Vitals and nursing note reviewed.  Constitutional:      Appearance: He is well-developed.     Comments: No acute distress  HENT:     Head: Normocephalic and atraumatic.     Nose: Nose normal.  Eyes:     Conjunctiva/sclera: Conjunctivae normal.  Cardiovascular:     Rate and Rhythm: Normal rate.  Pulmonary:     Effort: Pulmonary effort is normal. No respiratory distress.  Abdominal:     General: There is  no distension.  Musculoskeletal:        General: Normal range of motion.     Cervical back: Neck supple.     Comments: Neck: Nontender to palpation along cervical spine midline, full active range of motion  Full active range of motion of left shoulder elbow and wrist, nontender to palpation throughout the arm  Left index finger without any swelling erythema or discoloration, nontender to palpation throughout finger, full active range of motion, strength intact  Radial pulse 2+  Skin:    General: Skin is warm and dry.  Neurological:     Mental Status: He is alert and oriented to person, place, and time.      UC Treatments / Results  Labs (all labs ordered are listed, but only abnormal results are displayed) Labs Reviewed - No data to display  EKG   Radiology No results found.  Procedures Procedures (including critical care time)  Medications Ordered in UC Medications - No data to display  Initial Impression / Assessment and Plan / UC Course  I have reviewed the triage vital signs and the nursing notes.  Pertinent labs & imaging results that were available during my care of the patient were reviewed by me and considered in my medical decision making (see chart for details).     Unclear etiology of paresthesias of left index finger, low suspicion of underlying acute bony abnormality, deferring imaging.  Possible nerve irritation stemming from neck of the neck connecting pain or paresthesias.  Placing on prednisone course x6 days encouraged to follow-up with sports medicine if persisting.  No neuro deficits, strength intact, full active range of motion.  Discussed strict return precautions. Patient verbalized understanding and is agreeable with plan.  Final Clinical Impressions(s) / UC Diagnoses   Final diagnoses:  Finger numbness     Discharge Instructions     Begin prednisone taper x6 days-begin with 6 tablets on day 1, decrease by 1 tablet each day until  complete-6, 5, 4, 3, 2, 1-take with food and earlier in the day if possible  May supplement with tizanidine at home/bedtime for any neck/shoulder/back discomfort-this may cause drowsiness, do not drive or work after taking  Follow-up with sports medicine if symptoms persist    ED Prescriptions    Medication Sig Dispense Auth. Provider  predniSONE (DELTASONE) 10 MG tablet Begin with 6 tabs on day 1, 5 tab on day 2, 4 tab on day 3, 3 tab on day 4, 2 tab on day 5, 1 tab on day 6-take with food 21 tablet Thurlow Gallaga C, PA-C   tiZANidine (ZANAFLEX) 2 MG tablet Take 1-2 tablets (2-4 mg total) by mouth every 6 (six) hours as needed for muscle spasms. 30 tablet Jaeven Wanzer, [place] C, PA-C     PDMP not reviewed this encounter.   Lew Dawes, [place] 02/27/21 1333

## 2021-02-27 NOTE — Discharge Instructions (Signed)
Begin prednisone taper x6 days-begin with 6 tablets on day 1, decrease by 1 tablet each day until complete-6, 5, 4, 3, 2, 1-take with food and earlier in the day if possible  May supplement with tizanidine at home/bedtime for any neck/shoulder/back discomfort-this may cause drowsiness, do not drive or work after taking  Follow-up with sports medicine if symptoms persist

## 2021-02-27 NOTE — ED Triage Notes (Addendum)
Patient c/o LFT hand (index finger) numbness x 8 days.   Patient endorse having a fall a week and a half ago upon onset of symptoms. Patient states " I fell onto my hand on the concrete".   Patient endorses normal ROM.   Patient endorses numbness in finger worsens at night.   Patient denies any swelling.   Patient hasn't used any medications for symptoms.

## 2021-05-14 ENCOUNTER — Encounter (HOSPITAL_COMMUNITY): Payer: Self-pay | Admitting: Emergency Medicine

## 2021-05-14 ENCOUNTER — Emergency Department (HOSPITAL_COMMUNITY)
Admission: EM | Admit: 2021-05-14 | Discharge: 2021-05-14 | Disposition: A | Discharge status: Home / Self Care | Payer: 59 | Attending: Emergency Medicine | Admitting: Emergency Medicine

## 2021-05-14 ENCOUNTER — Emergency Department (HOSPITAL_COMMUNITY): Payer: 59

## 2021-05-14 DIAGNOSIS — R0789 Other chest pain: Secondary | ICD-10-CM | POA: Diagnosis not present

## 2021-05-14 DIAGNOSIS — M10072 Idiopathic gout, left ankle and foot: Secondary | ICD-10-CM | POA: Insufficient documentation

## 2021-05-14 DIAGNOSIS — R002 Palpitations: Secondary | ICD-10-CM | POA: Insufficient documentation

## 2021-05-14 DIAGNOSIS — M109 Gout, unspecified: Secondary | ICD-10-CM

## 2021-05-14 DIAGNOSIS — M545 Low back pain, unspecified: Secondary | ICD-10-CM | POA: Diagnosis not present

## 2021-05-14 DIAGNOSIS — F17290 Nicotine dependence, other tobacco product, uncomplicated: Secondary | ICD-10-CM | POA: Insufficient documentation

## 2021-05-14 LAB — BASIC METABOLIC PANEL
Anion gap: 10 (ref 5–15)
BUN: 7 mg/dL (ref 6–20)
CO2: 25 mmol/L (ref 22–32)
Calcium: 9.4 mg/dL (ref 8.9–10.3)
Chloride: 102 mmol/L (ref 98–111)
Creatinine, Ser: 1.06 mg/dL (ref 0.61–1.24)
GFR, Estimated: >60 mL/min (ref >60)
Glucose, Bld: 87 mg/dL (ref 70–99)
Potassium: 4.2 mmol/L (ref 3.5–5.1)
Sodium: 137 mmol/L (ref 135–145)

## 2021-05-14 LAB — URINALYSIS, ROUTINE W REFLEX MICROSCOPIC
Bilirubin Urine: NEGATIVE
Glucose, UA: NEGATIVE mg/dL
Hgb urine dipstick: NEGATIVE
Ketones, ur: 80 mg/dL — AB
Leukocytes,Ua: NEGATIVE
Nitrite: NEGATIVE
Protein, ur: NEGATIVE mg/dL
Specific Gravity, Urine: 1.016 (ref 1.005–1.030)
pH: 5 (ref 5.0–8.0)

## 2021-05-14 LAB — CBC
HCT: 46.1 % (ref 39.0–52.0)
Hemoglobin: 14.8 g/dL (ref 13.0–17.0)
MCH: 27.7 pg (ref 26.0–34.0)
MCHC: 32.1 g/dL (ref 30.0–36.0)
MCV: 86.2 fL (ref 80.0–100.0)
Platelets: 217 10*3/uL (ref 150–400)
RBC: 5.35 MIL/uL (ref 4.22–5.81)
RDW: 12.7 % (ref 11.5–15.5)
WBC: 9.6 10*3/uL (ref 4.0–10.5)
nRBC: 0 % (ref 0.0–0.2)

## 2021-05-14 LAB — TROPONIN I (HIGH SENSITIVITY): Troponin I (High Sensitivity): 4 ng/L (ref <18)

## 2021-05-14 LAB — D-DIMER, QUANTITATIVE: D-Dimer, Quant: 0.87 ug/mL-FEU — ABNORMAL HIGH (ref 0.00–0.50)

## 2021-05-14 MED ORDER — KETOROLAC TROMETHAMINE 30 MG/ML IJ SOLN
30.0000 mg | Freq: Once | INTRAMUSCULAR | Status: AC
  Administered 2021-05-14: 30 mg via INTRAVENOUS
  Filled 2021-05-14: qty 1

## 2021-05-14 MED ORDER — IOHEXOL 350 MG/ML SOLN
75.0000 mL | Freq: Once | INTRAVENOUS | Status: AC | PRN
  Administered 2021-05-14: 75 mL via INTRAVENOUS

## 2021-05-14 MED ORDER — SODIUM CHLORIDE 0.9 % IV BOLUS
1000.0000 mL | Freq: Once | INTRAVENOUS | Status: AC
  Administered 2021-05-14: 1000 mL via INTRAVENOUS

## 2021-05-14 MED ORDER — COLCHICINE 0.6 MG PO TABS
1.2000 mg | ORAL_TABLET | ORAL | Status: AC
  Administered 2021-05-14: 1.2 mg via ORAL
  Filled 2021-05-14: qty 2

## 2021-05-14 MED ORDER — COLCHICINE 0.6 MG PO TABS: 0.6000 mg | ORAL_TABLET | Freq: Two times a day (BID) | ORAL | 0 refills | Status: DC

## 2021-05-14 MED ORDER — DEXAMETHASONE SODIUM PHOSPHATE 10 MG/ML IJ SOLN
10.0000 mg | Freq: Once | INTRAMUSCULAR | Status: AC
Start: 2021-05-14 — End: 2021-05-14
  Administered 2021-05-14: 10 mg via INTRAVENOUS
  Filled 2021-05-14: qty 1

## 2021-05-14 MED ORDER — PREDNISONE 20 MG PO TABS: 40.0000 mg | ORAL_TABLET | Freq: Every day | ORAL | 0 refills | Status: DC

## 2021-05-14 NOTE — Discharge Instructions (Addendum)
Please see your family doctor for follow-up within 1 week if you are not doing any better but return to the emergency department immediately for severe or worsening symptoms.  I would like for you to take colchicine twice a day for the next 7 days or until the pain in your toe goes away.  I have also given you a prednisone pack for the next 5 days to help with your back.  ER for severe or worsening symptoms  Thank you for letting us take care of you today!  Please obtain all of your results from medical records or have your doctors office obtain the results - share them with your doctor - you should be seen at your doctors office in the next 2 days. Call today to arrange your follow up. Take the medications as prescribed. Please review all of the medicines and only take them if you do not have an allergy to them. Please be aware that if you are taking birth control pills, taking other prescriptions, ESPECIALLY ANTIBIOTICS may make the birth control ineffective - if this is the case, either do not engage in sexual activity or use alternative methods of birth control such as condoms until you have finished the medicine and your family doctor says it is OK to restart them. If you are on a blood thinner such as COUMADIN, be aware that any other medicine that you take may cause the coumadin to either work too much, or not enough - you should have your coumadin level rechecked in next 7 days if this is the case.  ?  It is also a possibility that you have an allergic reaction to any of the medicines that you have been prescribed - Everybody reacts differently to medications and while MOST people have no trouble with most medicines, you may have a reaction such as nausea, vomiting, rash, swelling, shortness of breath. If this is the case, please stop taking the medicine immediately and contact your physician.   If you were given a medication in the ED such as percocet, vicodin, or morphine, be aware that these  medicines are sedating and may change your ability to take care of yourself adequately for several hours after being given this medicines - you should not drive or take care of small children if you were given this medicine in the Emergency Department or if you have been prescribed these types of medicines. ?   You should return to the ER IMMEDIATELY if you develop severe or worsening symptoms.

## 2021-05-14 NOTE — ED Triage Notes (Addendum)
Patient complains of back and chest pain that has become more intense over time. Reports pain is exacerbated by movement. No dyspnea, no nausea vomiting. Patient alert, oriented, ambulatory, and in no apparent distress at this time.  Patient also requesting evaluation and treatment of gout in lower left leg.

## 2021-05-14 NOTE — ED Notes (Signed)
Patient transported to X-ray 

## 2021-05-14 NOTE — ED Provider Notes (Signed)
[facility] EMERGENCY DEPARTMENT Provider Note   CSN: [id number] Arrival date & time: 05/14/21  0715     History Chief Complaint  Patient presents with   Back Pain    Jose Caldwell is a 35 y.o. male.   Back Pain  This patient is a very pleasant 35 year old male, he does have a history of having several symptoms which have developed over the week.  #1 -he has developed gout, he has this is a recurrent problem in his left foot at the base of the first metatarsal.  He has been taking over-the-counter Aleve but it continues to be red swollen and tender, this is consistent with his prior history of gout.  #2 -subsequent to developing the gout the patient has been walking favoring his left foot and feels like his left lower back has been causing some discomfort.  When he lays perfectly still he has absolutely no pain but when he tries to move around such as walking standing up straight or turning over in bed it makes the pain worse.  This pain does not radiate to the right side to the upper back or down the legs.  There is no numbness or weakness associated with this.  There is no fevers chills nausea vomiting diarrhea, there is no associated history of IV drug use or history of cancer.  #3 -the patient is also having some intermittent chest pains with palpitations.  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  He reports that he has not had any swelling in his legs and has no history of thromboembolism but has occasional feeling of palpitations and chest discomfort.  Again no shortness of breath no cough no fever    Past Medical History:  Diagnosis Date   Gout     There are no problems to display for this patient.   No past surgical history on file.     Family History  Problem Relation Age of Onset   Healthy Mother    Healthy Father     Social History   Tobacco Use   Smoking status: Some Days    Types:  Cigars   Smokeless tobacco: Never  Vaping Use   Vaping Use: Never used  Substance Use Topics   Alcohol use: Yes    Alcohol/week: 2.0 standard drinks    Types: 2 Standard drinks or equivalent per week   Drug use: Never    Home Medications Prior to Admission medications   Medication Sig Start Date End Date Taking? Authorizing Provider  colchicine 0.6 MG tablet Take 1 tablet (0.6 mg total) by mouth 2 (two) times daily for 7 days. 05/14/21 05/21/21 Yes Eber Hong, MD  predniSONE (DELTASONE) 20 MG tablet Take 2 tablets (40 mg total) by mouth daily. 05/14/21  Yes Eber Hong, MD  tiZANidine (ZANAFLEX) 2 MG tablet Take 1-2 tablets (2-4 mg total) by mouth every 6 (six) hours as needed for muscle spasms. Patient not taking: No sig reported 02/27/21   Wieters, Fran Lowes C, PA-C    Allergies    Patient has no known allergies.  Review of Systems   Review of Systems  Musculoskeletal:  Positive for back pain.  All other systems reviewed and are negative.  Physical Exam Updated Vital Signs BP 118/71   Pulse 87   Temp 99 F (37.2 C) (Oral)   Resp (!) 22   SpO2 99%   Physical Exam Vitals and nursing  note reviewed.  Constitutional:      General: He is not in acute distress.    Appearance: He is well-developed.  HENT:     Head: Normocephalic and atraumatic.     Nose: Nose normal. No congestion or rhinorrhea.     Mouth/Throat:     Pharynx: No oropharyngeal exudate.  Eyes:     General: No scleral icterus.       Right eye: No discharge.        Left eye: No discharge.     Conjunctiva/sclera: Conjunctivae normal.     Pupils: Pupils are equal, round, and reactive to light.  Neck:     Thyroid: No thyromegaly.     Vascular: No JVD.  Cardiovascular:     Rate and Rhythm: Normal rate and regular rhythm.     Heart sounds: Normal heart sounds. No murmur heard.   No friction rub. No gallop.     Comments: Heart rate of 95 to 100 bpm, normal pulses, no JVD Pulmonary:     Effort: Pulmonary  effort is normal. No respiratory distress.     Breath sounds: Normal breath sounds. No wheezing or rales.  Abdominal:     General: Bowel sounds are normal. There is no distension.     Palpations: Abdomen is soft. There is no mass.     Tenderness: There is no abdominal tenderness.  Musculoskeletal:        General: Swelling and tenderness present. Normal range of motion.     Cervical back: Normal range of motion and neck supple.     Right lower leg: No edema.     Left lower leg: No edema.     Comments: Left great toe at the base of the first toe has redness warmth and tenderness with range of motion, no other significant abnormalities to the 4 extremities.  There is reproducible tenderness to the left lower back with movement but not with palpation  Lymphadenopathy:     Cervical: No cervical adenopathy.  Skin:    General: Skin is warm and dry.     Findings: No erythema or rash.  Neurological:     Mental Status: He is alert.     Coordination: Coordination normal.     Comments: Awake alert and able to follow all of my commands, normal mental status, normal strength in all 4 extremities, normal coordination  Psychiatric:        Behavior: Behavior normal.    ED Results / Procedures / Treatments   Labs (all labs ordered are listed, but only abnormal results are displayed) Labs Reviewed  URINALYSIS, ROUTINE W REFLEX MICROSCOPIC - Abnormal; Notable for the following components:      Result Value   Ketones, ur 80 (*)    All other components within normal limits  D-DIMER, QUANTITATIVE - Abnormal; Notable for the following components:   D-Dimer, Quant 0.87 (*)    All other components within normal limits  BASIC METABOLIC PANEL  CBC  TROPONIN I (HIGH SENSITIVITY)    EKG EKG Interpretation  Date/Time:  Thursday May 14 2021 07:21:15 EDT Ventricular Rate:  116 PR Interval:  134 QRS Duration: 82 QT Interval:  332 QTC Calculation: 461 R Axis:   65 Text Interpretation: Sinus  tachycardia Cannot rule out Anterior infarct , age undetermined Abnormal ECG No old tracing to compare Confirmed by Eber HongMiller, Keandre Linden ([id number]) on 05/14/2021 7:30:31 AM  Radiology DG Chest 2 View  Result Date: 05/14/2021 CLINICAL DATA:  Chest pain. EXAM: CHEST -  2 VIEW COMPARISON:  None. FINDINGS: The heart size and mediastinal contours are within normal limits. Both lungs are clear. No visible pleural effusions or pneumothorax. No acute osseous abnormality. IMPRESSION: No active cardiopulmonary disease. Electronically Signed   By: Feliberto Harts MD   On: 05/14/2021 07:40   CT Angio Chest PE W and/or Wo Contrast  Result Date: 05/14/2021 CLINICAL DATA:  Chest pain and shortness of breath. Positive D-dimer. EXAM: CT ANGIOGRAPHY CHEST WITH CONTRAST TECHNIQUE: Multidetector CT imaging of the chest was performed using the standard protocol during bolus administration of intravenous contrast. Multiplanar CT image reconstructions and MIPs were obtained to evaluate the vascular anatomy. CONTRAST:  31mL OMNIPAQUE IOHEXOL 350 MG/ML SOLN COMPARISON:  None. FINDINGS: Cardiovascular: The heart is normal in size. No pericardial effusion. The aorta is normal in caliber. No dissection. The branch vessels are patent. The pulmonary arterial tree is fairly well opacified. No filling defects to suggest pulmonary embolism. Mediastinum/Nodes: No mediastinal or hilar mass or adenopathy. There is a small amount of residual thymic tissue noted in the anterior mediastinum. The esophagus is grossly normal. Lungs/Pleura: No acute pulmonary findings. No pleural effusion or pulmonary lesions. Upper Abdomen: The upper abdomen is unremarkable. Musculoskeletal: No chest wall mass, supraclavicular or axillary adenopathy. The bony thorax is intact. Mild midthoracic degenerative changes are noted. Review of the MIP images confirms the above findings. IMPRESSION: 1. No CT findings for pulmonary embolism. 2. Normal thoracic aorta. 3. No acute  pulmonary findings. Electronically Signed   By: Rudie Meyer M.D.   On: 05/14/2021 12:01    Procedures Procedures   Medications Ordered in ED Medications  ketorolac (TORADOL) 30 MG/ML injection 30 mg (30 mg Intravenous Given 05/14/21 0824)  dexamethasone (DECADRON) injection 10 mg (10 mg Intravenous Given 05/14/21 0824)  colchicine tablet 1.2 mg (1.2 mg Oral Given 05/14/21 0824)  sodium chloride 0.9 % bolus 1,000 mL (0 mLs Intravenous Stopped 05/14/21 1101)  iohexol (OMNIPAQUE) 350 MG/ML injection 75 mL (75 mLs Intravenous Contrast Given 05/14/21 1130)    ED Course  I have reviewed the triage vital signs and the nursing notes.  Pertinent labs & imaging results that were available during my care of the patient were reviewed by me and considered in my medical decision making (see chart for details).    MDM Rules/Calculators/A&P                          The patient's EKG is unremarkable, it was only a mild sinus tachycardia which she no longer has.  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  He will be given colchicine dexamethasone and Toradol, I will also check a D-dimer, his chest x-ray is unremarkable.  The patient is agreeable to the plan.  I do not think this is coronary ischemia, he does not have a fever, this does not sound like any high risk features for back pain in fact it sounds like his back is been hurting since he has been favoring his foot likely related to strain of his lumbar spine area.  Patient is agreeable to the plan  Labs unremarkable except for an elevated D-dimer, CT negative, patient stable for discharge home medications.  Final Clinical Impression(s) / ED Diagnoses Final diagnoses:  Acute left-sided low back pain without sciatica  Acute gout involving toe of left  foot, unspecified cause    Rx / DC Orders ED Discharge Orders           Ordered    colchicine 0.6 MG tablet  2 times daily        05/14/21 1211    predniSONE (DELTASONE) 20 MG tablet  Daily        05/14/21 1211             Eber Hong, MD 05/14/21 1213

## 2021-08-27 ENCOUNTER — Other Ambulatory Visit: Payer: Self-pay

## 2021-08-27 ENCOUNTER — Emergency Department
Admission: EM | Admit: 2021-08-27 | Discharge: 2021-08-27 | Disposition: A | Discharge status: Home / Self Care | Payer: 59 | Attending: Emergency Medicine | Admitting: Emergency Medicine

## 2021-08-27 ENCOUNTER — Emergency Department: Payer: 59

## 2021-08-27 ENCOUNTER — Encounter: Payer: Self-pay | Admitting: Emergency Medicine

## 2021-08-27 DIAGNOSIS — M549 Dorsalgia, unspecified: Secondary | ICD-10-CM | POA: Insufficient documentation

## 2021-08-27 DIAGNOSIS — R109 Unspecified abdominal pain: Secondary | ICD-10-CM | POA: Insufficient documentation

## 2021-08-27 DIAGNOSIS — F17290 Nicotine dependence, other tobacco product, uncomplicated: Secondary | ICD-10-CM | POA: Diagnosis not present

## 2021-08-27 LAB — BASIC METABOLIC PANEL
Anion gap: 8 (ref 5–15)
BUN: 11 mg/dL (ref 6–20)
CO2: 29 mmol/L (ref 22–32)
Calcium: 9.4 mg/dL (ref 8.9–10.3)
Chloride: 102 mmol/L (ref 98–111)
Creatinine, Ser: 1.06 mg/dL (ref 0.61–1.24)
GFR, Estimated: >60 mL/min (ref >60)
Glucose, Bld: 103 mg/dL — ABNORMAL HIGH (ref 70–99)
Potassium: 3.7 mmol/L (ref 3.5–5.1)
Sodium: 139 mmol/L (ref 135–145)

## 2021-08-27 LAB — URINALYSIS, ROUTINE W REFLEX MICROSCOPIC
Bilirubin Urine: NEGATIVE
Glucose, UA: NEGATIVE mg/dL
Ketones, ur: 5 mg/dL — AB
Leukocytes,Ua: NEGATIVE
Nitrite: NEGATIVE
Protein, ur: 30 mg/dL — AB
Specific Gravity, Urine: 1.026 (ref 1.005–1.030)
Squamous Epithelial / HPF: NONE SEEN (ref 0–5)
pH: 5 (ref 5.0–8.0)

## 2021-08-27 LAB — CBC
HCT: 43 % (ref 39.0–52.0)
Hemoglobin: 14.6 g/dL (ref 13.0–17.0)
MCH: 28.7 pg (ref 26.0–34.0)
MCHC: 34 g/dL (ref 30.0–36.0)
MCV: 84.6 fL (ref 80.0–100.0)
Platelets: 269 10*3/uL (ref 150–400)
RBC: 5.08 MIL/uL (ref 4.22–5.81)
RDW: 13.1 % (ref 11.5–15.5)
WBC: 10 10*3/uL (ref 4.0–10.5)
nRBC: 0 % (ref 0.0–0.2)

## 2021-08-27 LAB — HEPATIC FUNCTION PANEL
ALT: 71 U/L — ABNORMAL HIGH (ref 0–44)
AST: 43 U/L — ABNORMAL HIGH (ref 15–41)
Albumin: 4 g/dL (ref 3.5–5.0)
Alkaline Phosphatase: 68 U/L (ref 38–126)
Bilirubin, Direct: <0.1 mg/dL (ref 0.0–0.2)
Total Bilirubin: 0.9 mg/dL (ref 0.3–1.2)
Total Protein: 8.2 g/dL — ABNORMAL HIGH (ref 6.5–8.1)

## 2021-08-27 LAB — LIPASE, BLOOD: Lipase: 30 U/L (ref 11–51)

## 2021-08-27 MED ORDER — KETOROLAC TROMETHAMINE 60 MG/2ML IM SOLN
60.0000 mg | Freq: Once | INTRAMUSCULAR | Status: AC
  Administered 2021-08-27: 60 mg via INTRAMUSCULAR
  Filled 2021-08-27: qty 2

## 2021-08-27 MED ORDER — CEPHALEXIN 500 MG PO CAPS: 500.0000 mg | ORAL_CAPSULE | Freq: Two times a day (BID) | ORAL | 0 refills | Status: DC

## 2021-08-27 MED ORDER — CEPHALEXIN 500 MG PO CAPS
500.0000 mg | ORAL_CAPSULE | Freq: Once | ORAL | Status: AC
  Administered 2021-08-27: 500 mg via ORAL
  Filled 2021-08-27: qty 1

## 2021-08-27 NOTE — ED Notes (Signed)
Provided patient with graham crackers and water for administering Keflex.

## 2021-08-27 NOTE — ED Notes (Signed)
Pt presents to ED with R sided flank/back pain. Pt states HX of chronic back pain. Pt states this started today. Pt denies HX of kidney stones. NAD noted. Pt denies urinary symptoms. Pt denies N/V/D. Pt is A&Ox4.

## 2021-08-27 NOTE — ED Provider Notes (Signed)
[facility] Emergency Department Provider Note ____________________________________________   Event Date/Time   First MD Initiated Contact with Patient 08/27/21 [phone]     (approximate)  I have reviewed the triage vital signs and the nursing notes.  HISTORY  Chief Complaint Flank Pain  HPI Jose Caldwell is a 35 y.o. male previous medical history of gout otherwise reports no major medical issues  For a few days now has been having pain along his right mid back and flank.  Nothing seems Nestl make it better or worse.  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  Does not radiate into his legs there is no numbness tingling or weakness.  No nausea no vomiting.  He has not noticed any abdominal pain.  The pain is seated right over his right mid back he points towards his right low thoracic paraspinous region  It is made worse by position, seems actually get a little better if he walks around and moves about and seems to be worse or more noticed when sitting still.  Past Medical History:  Diagnosis Date   Gout     There are no problems to display for this patient.   History reviewed. No pertinent surgical history.  Prior to Admission medications   Medication Sig Start Date End Date Taking? Authorizing Provider  cephALEXin (KEFLEX) 500 MG capsule Take 1 capsule (500 mg total) by mouth 2 (two) times daily. 08/27/21  Yes Sharyn Creamer, MD  colchicine 0.6 MG tablet Take 1 tablet (0.6 mg total) by mouth 2 (two) times daily for 7 days. 05/14/21 05/21/21  Eber Hong, MD  predniSONE (DELTASONE) 20 MG tablet Take 2 tablets (40 mg total) by mouth daily. Patient not taking: No sig reported 05/14/21   Eber Hong, MD  tiZANidine (ZANAFLEX) 2 MG tablet Take 1-2 tablets (2-4 mg total) by mouth every 6 (six) hours as needed for muscle spasms. 02/27/21   Wieters, Hallie C, PA-C    Allergies Patient has no known allergies.  Family History   Problem Relation Age of Onset   Healthy Mother    Healthy Father     Social History Social History   Tobacco Use   Smoking status: Some Days    Types: Cigars   Smokeless tobacco: Never  Vaping Use   Vaping Use: Never used  Substance Use Topics   Alcohol use: Yes    Alcohol/week: 2.0 standard drinks    Types: 2 Standard drinks or equivalent per week   Drug use: Never    Review of Systems Constitutional: No fever.  Like a little bit of chilled feeling when the pain is present Eyes: No visual changes. Cardiovascular: Denies chest pain. Respiratory: Denies shortness of breath. Gastrointestinal: No abdominal pain.   Genitourinary: Negative for dysuria. Musculoskeletal: Negative for back pain. Skin: Negative for rash. Neurological: Negative for headaches, areas of focal weakness or numbness.    ____________________________________________   PHYSICAL EXAM:  VITAL SIGNS: ED Triage Vitals  Enc Vitals Group     BP 08/27/21 0823 (!) 159/109     Pulse Rate 08/27/21 0823 98     Resp 08/27/21 0823 20     Temp 08/27/21 0823 97.8 F (36.6 C)     Temp Source 08/27/21 0823 Oral     SpO2 08/27/21 0823 99 %     Weight 08/27/21 0824 (!) 325 lb (147.4 kg)     Height 08/27/21 0824 6\' 3"  (1.905 m)  Head Circumference --      Peak Flow --      Pain Score 08/27/21 0824 10     Pain Loc --      Pain Edu? --      Excl. in GC? --     Constitutional: Alert and oriented. Well appearing and in no acute distress.  Does report moderate discomfort in his right flank Eyes: Conjunctivae are normal. Head: Atraumatic. Nose: No congestion/rhinnorhea. Mouth/Throat: Mucous membranes are moist. Neck: No stridor.  Cardiovascular: Normal rate, regular rhythm. Grossly normal heart sounds.  Good peripheral circulation. Respiratory: Normal respiratory effort.  No retractions. Lungs CTAB. Gastrointestinal: Soft and nontender. No distention.  No right upper quadrant pain.  Denies pain to  palpation on all quadrants of abdominal exam.  Does report moderate tenderness to palpation along the right paraspinous musculature and also slight discomfort to CVA percussion Musculoskeletal: No lower extremity tenderness nor edema.  Normal vascular exam lower extremity  Neurologic:  Normal speech and language. No gross focal neurologic deficits are appreciated.  Skin:  Skin is warm, dry and intact. No rash noted. Psychiatric: Mood and affect are normal. Speech and behavior are normal.  ____________________________________________   LABS (all labs ordered are listed, but only abnormal results are displayed)  Labs Reviewed  URINALYSIS, ROUTINE W REFLEX MICROSCOPIC - Abnormal; Notable for the following components:      Result Value   Color, Urine AMBER (*)    APPearance HAZY (*)    Hgb urine dipstick MODERATE (*)    Ketones, ur 5 (*)    Protein, ur 30 (*)    Bacteria, UA FEW (*)    All other components within normal limits  BASIC METABOLIC PANEL - Abnormal; Notable for the following components:   Glucose, Bld 103 (*)    All other components within normal limits  HEPATIC FUNCTION PANEL - Abnormal; Notable for the following components:   Total Protein 8.2 (*)    AST 43 (*)    ALT 71 (*)    All other components within normal limits  URINE CULTURE  CBC  LIPASE, BLOOD  ____________________________________________  RADIOLOGY  CT Renal Stone Study  Result Date: 08/27/2021 CLINICAL DATA:  Intermittent right flank pain concern for renal stones. EXAM: CT ABDOMEN AND PELVIS WITHOUT CONTRAST TECHNIQUE: Multidetector CT imaging of the abdomen and pelvis was performed following the standard protocol without IV contrast. COMPARISON:  None. FINDINGS: Lower chest: Hypoventilatory change in the dependent lungs. Hepatobiliary: No focal liver abnormality is seen. No gallstones, gallbladder wall thickening, or biliary dilatation. Pancreas: Unremarkable. No pancreatic ductal dilatation or  surrounding inflammatory changes. Spleen: Normal in size without focal abnormality. Adrenals/Urinary Tract: Bilateral adrenal glands are unremarkable. No hydronephrosis. No renal, ureteral or bladder calculi visualized. Relative hypoattenuation of the left kidney in comparison to the right. Hypodense right renal lesions incompletely evaluated without intravenous contrast material but favored to reflect cysts. Symmetric wall thickening of a predominantly decompressed urinary bladder. Stomach/Bowel: Stomach is unremarkable for degree of distension. No pathologic dilation of small or large bowel. The appendix and terminal ileum appear normal. No evidence of acute bowel inflammation. Vascular/Lymphatic: No abdominal aortic aneurysm. No pathologically enlarged abdominal or pelvic lymph nodes. Reproductive: The prostate gland and seminal vesicles appear unremarkable. Other: No abdominopelvic ascites. Musculoskeletal: No acute osseous abnormality. IMPRESSION: 1. Relative hypoattenuation of the left kidney in comparison to the right, which may represent pyelonephritis. Correlation with urinalysis is recommended. 2. Symmetric wall thickening of a predominantly decompressed urinary bladder.  Correlate with urinalysis to exclude cystitis. 3. No renal, ureteral, or bladder calculi visualized. No hydronephrosis. Electronically Signed   By: Maudry Mayhew M.D.   On: 08/27/2021 10:51     CT imaging reviewed some pulmonary hypoattenuation in the left kidney.  Also noted is wall thickening of the urinary bladder. ____________________________________________   PROCEDURES  Procedure(s) performed: None  Procedures  Critical Care performed: No  ____________________________________________   INITIAL IMPRESSION / ASSESSMENT AND PLAN / ED COURSE  Pertinent labs & imaging results that were available during my care of the patient were reviewed by me and considered in my medical decision making (see chart for details).    Differential diagnosis includes but is not limited to, abdominal perforation, aortic dissection, cholecystitis, appendicitis, diverticulitis, colitis, esophagitis/gastritis, kidney stone, pyelonephritis, urinary tract infection, aortic aneurysm. All are considered in decision and treatment plan. Based upon the patient's presentation and risk factors, based on the location of his pain I suspect this may be musculoskeletal in nature or potentially secondary to etiology such as nephrolithiasis.  Denies right upper quadrant pain negative Murphy.  No nausea vomiting no abdominal pain on exam.  No chest pain no trouble breathing no cough.  No acute neurologic or vascular symptoms.  Pain does not appear to be obviously radicular in nature.  Overall reassuring nontoxic exam.  We will trial Toradol and obtain CT imaging to evaluate for further etiology such as kidney stone  Urine culture sent, noted to have some hemoglobin present as well as slight amber color.  Patient denies pain or burning with urination no fever.  No obvious infectious symptomatology  ----------------------------------------- 12:46 PM on 08/27/2021 ----------------------------------------- Patient's imaging is reassuring, interestingly enough that his flank pain is primarily right-sided though his imaging does suggest some possible hypoattenuation in the left kidney and some bladder thickening.  Given the symptoms given the pain location and the fact that he is having some chills as well we will treat with antibiotic and culture urine.  Discussed with him and he has a primary care follow-up on Saturday which she will attend and also made recommendation given the situation that he follow-up closely with the urologist which she is also agreeable to scheduling.  We discussed careful return precautions.  His pain is very well controlled now he feels much improved resting comfortably without distress  Return precautions and treatment  recommendations and follow-up discussed with the patient who is agreeable with the plan.      ____________________________________________   FINAL CLINICAL IMPRESSION(S) / ED DIAGNOSES  Final diagnoses:  Flank pain        Note:  This document was prepared using Dragon voice recognition software and may include unintentional dictation errors       Sharyn Creamer, MD 08/27/21 1247

## 2021-08-27 NOTE — ED Triage Notes (Signed)
Pt reports pain to his right flank that is intermittent and gives him chills. Denies urinary sx's.

## 2021-08-27 NOTE — Discharge Instructions (Addendum)
As we discussed I believe your symptoms are from a kidney infection, however pain may also be from another cause such as back pain or musculoskeletal pain or other condition.  I do recommend you follow-up on Saturday with your new primary care doctor as you have scheduled, and also with your symptoms today I do recommend a follow-up with our urologist.  Please call to schedule an appointment.  Return to the ER right away if you have worsening symptoms severe pain began having vomiting any dark black or bloody stools, or feel your condition is worsening or other new concerns arise.

## 2021-08-27 NOTE — ED Notes (Signed)
Lab called to add lipase and hepatic function panel to already sent specimen.

## 2021-08-28 LAB — URINE CULTURE: Culture: NO GROWTH

## 2021-10-02 ENCOUNTER — Ambulatory Visit
Admission: EM | Admit: 2021-10-02 | Discharge: 2021-10-02 | Disposition: A | Discharge status: Home / Self Care | Payer: 59 | Attending: Internal Medicine | Admitting: Internal Medicine

## 2021-10-02 ENCOUNTER — Emergency Department: Payer: 59

## 2021-10-02 ENCOUNTER — Other Ambulatory Visit: Payer: Self-pay

## 2021-10-02 ENCOUNTER — Encounter: Payer: Self-pay | Admitting: Emergency Medicine

## 2021-10-02 DIAGNOSIS — F17290 Nicotine dependence, other tobacco product, uncomplicated: Secondary | ICD-10-CM | POA: Diagnosis not present

## 2021-10-02 DIAGNOSIS — M79672 Pain in left foot: Secondary | ICD-10-CM

## 2021-10-02 DIAGNOSIS — R509 Fever, unspecified: Secondary | ICD-10-CM | POA: Diagnosis not present

## 2021-10-02 DIAGNOSIS — R310 Gross hematuria: Secondary | ICD-10-CM | POA: Diagnosis not present

## 2021-10-02 DIAGNOSIS — D72829 Elevated white blood cell count, unspecified: Secondary | ICD-10-CM | POA: Diagnosis not present

## 2021-10-02 DIAGNOSIS — Z20822 Contact with and (suspected) exposure to covid-19: Secondary | ICD-10-CM | POA: Insufficient documentation

## 2021-10-02 DIAGNOSIS — M109 Gout, unspecified: Secondary | ICD-10-CM | POA: Diagnosis not present

## 2021-10-02 DIAGNOSIS — M545 Low back pain, unspecified: Secondary | ICD-10-CM

## 2021-10-02 HISTORY — DX: Renal tubulo-interstitial disease, unspecified: N15.9

## 2021-10-02 LAB — BASIC METABOLIC PANEL
Anion gap: 8 (ref 5–15)
BUN: 10 mg/dL (ref 6–20)
CO2: 26 mmol/L (ref 22–32)
Calcium: 9.3 mg/dL (ref 8.9–10.3)
Chloride: 102 mmol/L (ref 98–111)
Creatinine, Ser: 0.92 mg/dL (ref 0.61–1.24)
GFR, Estimated: >60 mL/min (ref >60)
Glucose, Bld: 108 mg/dL — ABNORMAL HIGH (ref 70–99)
Potassium: 4 mmol/L (ref 3.5–5.1)
Sodium: 136 mmol/L (ref 135–145)

## 2021-10-02 LAB — POCT URINALYSIS DIP (MANUAL ENTRY)
Glucose, UA: NEGATIVE mg/dL
Leukocytes, UA: NEGATIVE
Nitrite, UA: NEGATIVE
Protein Ur, POC: 30 mg/dL — AB
Spec Grav, UA: >=1.03 — AB (ref 1.010–1.025)
Urobilinogen, UA: 0.2 E.U./dL
pH, UA: 6 (ref 5.0–8.0)

## 2021-10-02 LAB — CBC
HCT: 42.7 % (ref 39.0–52.0)
Hemoglobin: 13.8 g/dL (ref 13.0–17.0)
MCH: 27.3 pg (ref 26.0–34.0)
MCHC: 32.3 g/dL (ref 30.0–36.0)
MCV: 84.6 fL (ref 80.0–100.0)
Platelets: 236 10*3/uL (ref 150–400)
RBC: 5.05 MIL/uL (ref 4.22–5.81)
RDW: 13.3 % (ref 11.5–15.5)
WBC: 14.3 10*3/uL — ABNORMAL HIGH (ref 4.0–10.5)
nRBC: 0 % (ref 0.0–0.2)

## 2021-10-02 LAB — RESP PANEL BY RT-PCR (FLU A&B, COVID) ARPGX2
Influenza A by PCR: NEGATIVE
Influenza B by PCR: NEGATIVE
SARS Coronavirus 2 by RT PCR: NEGATIVE

## 2021-10-02 LAB — LACTIC ACID, PLASMA: Lactic Acid, Venous: 1.3 mmol/L (ref 0.5–1.9)

## 2021-10-02 MED ORDER — ACETAMINOPHEN 500 MG PO TABS
1000.0000 mg | ORAL_TABLET | Freq: Once | ORAL | Status: AC
  Administered 2021-10-02: 1000 mg via ORAL
  Filled 2021-10-02: qty 2

## 2021-10-02 NOTE — Discharge Instructions (Signed)
Please go to the hospital as soon as you leave urgent care for further evaluation and management. 

## 2021-10-02 NOTE — ED Triage Notes (Signed)
Pt endorses lower left back pain for 3 days. Gout to left foot since yesterday. Recently had a kidney infection and improved after abx.

## 2021-10-02 NOTE — ED Provider Notes (Signed)
Emergency Medicine Provider Triage Evaluation Note  Jose Caldwell , a 35 y.o. male  was evaluated in triage.  Pt complains of left low back pain, left foot pain.  Patient says several days of left low back pain with no trauma or injury.  Denies any reports of fevers, abdominal pain nausea or vomiting.  No urinary symptoms.  Little over 1 month ago was seen in the ER, had concern for possible left-sided Pilo on CT scan, urine and urine cultures appeared clean but patient was placed on antibiotic and states his symptoms resolved.  Patient's original back pain 1 month ago was on the right side but over the last couple days his pain has been on the left side.  He denies any radicular symptoms.  He also complains of gout flareup to his left foot.  No trauma or injury.  No chest pain or shortness of breath  No history of IV drug abuse  Review of Systems  Positive: Left lower back pain, fever left great toe pain Negative: Chest pain, shortness of breath, abdominal pain, nausea, vomiting  Physical Exam  BP (!) 135/102   Pulse (!) 130   Temp (!) 100.5 F (38.1 C)   Resp 18   Ht 6\' 3"  (1.905 m)   Wt (!) 142.9 kg   SpO2 97%   BMI 39.37 kg/m  Gen:   Awake, no distress   Resp:  Normal effort  MSK:   Moves extremities without difficulty  Other:    Medical Decision Making  Medically screening exam initiated at 6:45 PM.  Appropriate orders placed.  Jose Caldwell was informed that the remainder of the evaluation will be completed by another provider, this initial triage assessment does not replace that evaluation, and the importance of remaining in the ED until their evaluation is complete.     Jose Handler, PA-C 10/02/21 14/02/22    Jose Fling, MD 10/02/21 2013

## 2021-10-02 NOTE — ED Triage Notes (Signed)
Pt c/o hematuria, lower back pain. States he had a work physical done today and was told his urine had blood in it and some other result that pt is unable to describe. Back pain started around Monday.   States he had a kidney infection a few weeks ago and it feels like it is coming back. Also pt states has gout and is currently having a flare as well. Gout is in the left foot. Gout started yesterday.   Pt currently on colchicine for gout but is interested in something else stating the pain/event is not well controlled.

## 2021-10-02 NOTE — ED Provider Notes (Signed)
[facility]    CSN: [id number] Arrival date & time: 10/02/21  1304      History   Chief Complaint Chief Complaint  Patient presents with   Hematuria    HPI Jose Caldwell is a 35 y.o. male.   Patient presents with hematuria and left lower back pain.  Back pain started around Monday.  Hematuria has been present for several weeks.  Patient was seen in the ED on 08/27/2021 and diagnosed with pyelonephritis from a CT scan.  He was placed on cephalexin antibiotic, but patient reports that he did not see complete eradication of symptoms with that current treatment plan.  Patient denies any urinary burning, urinary frequency, penile discharge, testicular pain, any known fevers, abdominal pain.  Denies any known exposure to STD.  Patient also presents with left foot pain.  Reports that he has a history of gout.  Has been taking colchicine with no improvement in symptoms or pain.  Patient reports that he has been drinking alcohol a lot lately.   Hematuria   Past Medical History:  Diagnosis Date   Gout    Kidney infection     There are no problems to display for this patient.   History reviewed. No pertinent surgical history.     Home Medications    Prior to Admission medications   Medication Sig Start Date End Date Taking? Authorizing Provider  cephALEXin (KEFLEX) 500 MG capsule Take 1 capsule (500 mg total) by mouth 2 (two) times daily. 08/27/21   Delman Kitten, MD  colchicine 0.6 MG tablet Take 1 tablet (0.6 mg total) by mouth 2 (two) times daily for 7 days. 05/14/21 05/21/21  Noemi Chapel, MD  predniSONE (DELTASONE) 20 MG tablet Take 2 tablets (40 mg total) by mouth daily. Patient not taking: Reported on 08/27/2021 05/14/21   Noemi Chapel, MD  tiZANidine (ZANAFLEX) 2 MG tablet Take 1-2 tablets (2-4 mg total) by mouth every 6 (six) hours as needed for muscle spasms. 02/27/21   Wieters, Elesa Hacker, PA-C    Family History Family History  Problem Relation Age of  Onset   Healthy Mother    Healthy Father     Social History Social History   Tobacco Use   Smoking status: Some Days    Types: Cigars   Smokeless tobacco: Never  Vaping Use   Vaping Use: Never used  Substance Use Topics   Alcohol use: Yes    Alcohol/week: 2.0 standard drinks    Types: 2 Standard drinks or equivalent per week   Drug use: Never     Allergies   Patient has no known allergies.   Review of Systems Review of Systems Per HPI  Physical Exam Triage Vital Signs ED Triage Vitals [10/02/21 1515]  Enc Vitals Group     BP (!) 154/90     Pulse Rate (!) 123     Resp 18     Temp 99.2 F (37.3 C)     Temp Source Oral     SpO2 97 %     Weight      Height      Head Circumference      Peak Flow      Pain Score 8     Pain Loc      Pain Edu?      Excl. in [place]?    No data found.  Updated Vital Signs BP (!) 154/90 (BP Location: Right Arm)   Pulse (!) 123   Temp 99.2  F (37.3 C) (Oral)   Resp 18   SpO2 97%   Visual Acuity Right Eye Distance:   Left Eye Distance:   Bilateral Distance:    Right Eye Near:   Left Eye Near:    Bilateral Near:     Physical Exam Constitutional:      General: He is not in acute distress.    Appearance: Normal appearance. He is not toxic-appearing or diaphoretic.  HENT:     Head: Normocephalic and atraumatic.  Eyes:     Extraocular Movements: Extraocular movements intact.     Conjunctiva/sclera: Conjunctivae normal.     Pupils: Pupils are equal, round, and reactive to light.  Cardiovascular:     Rate and Rhythm: Regular rhythm. Tachycardia present.     Pulses: Normal pulses.  Pulmonary:     Effort: Pulmonary effort is normal. No respiratory distress.     Breath sounds: Normal breath sounds.  Neurological:     General: No focal deficit present.     Mental Status: He is alert and oriented to person, place, and time. Mental status is at baseline.  Psychiatric:        Mood and Affect: Mood normal.        Behavior:  Behavior normal.        Thought Content: Thought content normal.        Judgment: Judgment normal.     UC Treatments / Results  Labs (all labs ordered are listed, but only abnormal results are displayed) Labs Reviewed  POCT URINALYSIS DIP (MANUAL ENTRY) - Abnormal; Notable for the following components:      Result Value   Clarity, UA hazy (*)    Bilirubin, UA small (*)    Ketones, POC UA >= (160) (*)    Spec Grav, UA >=1.030 (*)    Blood, UA small (*)    Protein Ur, POC =30 (*)    All other components within normal limits    EKG   Radiology No results found.  Procedures Procedures (including critical care time)  Medications Ordered in UC Medications - No data to display  Initial Impression / Assessment and Plan / UC Course  I have reviewed the triage vital signs and the nursing notes.  Pertinent labs & imaging results that were available during my care of the patient were reviewed by me and considered in my medical decision making (see chart for details).     Urinalysis was not definitive for urinary tract infection.  Although there are other abnormalities including a small amount of bilirubin.  This is worrisome as patient had slightly elevated liver enzymes at ED visit on 08/27/2021.  Patient also has low-grade fever and elevated heart rate.  This is suspicious for worsening infection.  Advised patient that it would be best for him to go to the hospital for further evaluation and management as further blood work and imaging may be necessary to rule out worrisome etiologies.  Patient voiced understanding.  Vital signs stable at discharge.  Agree with patient self transport to the hospital. Final Clinical Impressions(s) / UC Diagnoses   Final diagnoses:  Gross hematuria  Acute left-sided low back pain without sciatica  Left foot pain     Discharge Instructions      Please go to the hospital as soon as you leave urgent care for further evaluation and  management.    ED Prescriptions   None    PDMP not reviewed this encounter.   [address] [employer] E,  FNP 10/02/21 1546

## 2021-10-03 ENCOUNTER — Emergency Department
Admission: EM | Admit: 2021-10-03 | Discharge: 2021-10-03 | Disposition: A | Discharge status: Home / Self Care | Payer: 59 | Attending: Emergency Medicine | Admitting: Emergency Medicine

## 2021-10-03 ENCOUNTER — Emergency Department: Payer: 59

## 2021-10-03 DIAGNOSIS — M545 Low back pain, unspecified: Secondary | ICD-10-CM

## 2021-10-03 DIAGNOSIS — R509 Fever, unspecified: Secondary | ICD-10-CM

## 2021-10-03 DIAGNOSIS — M109 Gout, unspecified: Secondary | ICD-10-CM

## 2021-10-03 LAB — URINALYSIS, COMPLETE (UACMP) WITH MICROSCOPIC
Bacteria, UA: NONE SEEN
Bilirubin Urine: NEGATIVE
Glucose, UA: NEGATIVE mg/dL
Ketones, ur: 80 mg/dL — AB
Leukocytes,Ua: NEGATIVE
Nitrite: NEGATIVE
Protein, ur: NEGATIVE mg/dL
Specific Gravity, Urine: 1.024 (ref 1.005–1.030)
Squamous Epithelial / HPF: NONE SEEN (ref 0–5)
pH: 5 (ref 5.0–8.0)

## 2021-10-03 LAB — HEPATIC FUNCTION PANEL
ALT: 25 U/L (ref 0–44)
AST: 21 U/L (ref 15–41)
Albumin: 4.1 g/dL (ref 3.5–5.0)
Alkaline Phosphatase: 66 U/L (ref 38–126)
Bilirubin, Direct: 0.2 mg/dL (ref 0.0–0.2)
Indirect Bilirubin: 1.2 mg/dL — ABNORMAL HIGH (ref 0.3–0.9)
Total Bilirubin: 1.4 mg/dL — ABNORMAL HIGH (ref 0.3–1.2)
Total Protein: 7.9 g/dL (ref 6.5–8.1)

## 2021-10-03 LAB — LIPASE, BLOOD: Lipase: 26 U/L (ref 11–51)

## 2021-10-03 LAB — LACTIC ACID, PLASMA: Lactic Acid, Venous: 1.1 mmol/L (ref 0.5–1.9)

## 2021-10-03 LAB — URIC ACID: Uric Acid, Serum: 8.5 mg/dL (ref 3.7–8.6)

## 2021-10-03 MED ORDER — SODIUM CHLORIDE 0.9 % IV BOLUS
500.0000 mL | Freq: Once | INTRAVENOUS | Status: AC
  Administered 2021-10-03: 500 mL via INTRAVENOUS

## 2021-10-03 MED ORDER — KETOROLAC TROMETHAMINE 30 MG/ML IJ SOLN
30.0000 mg | Freq: Once | INTRAMUSCULAR | Status: AC
Start: 2021-10-03 — End: 2021-10-03
  Administered 2021-10-03: 30 mg via INTRAVENOUS
  Filled 2021-10-03: qty 1

## 2021-10-03 MED ORDER — IOHEXOL 300 MG/ML  SOLN
100.0000 mL | Freq: Once | INTRAMUSCULAR | Status: AC | PRN
  Administered 2021-10-03: 100 mL via INTRAVENOUS

## 2021-10-03 MED ORDER — COLCHICINE 0.6 MG PO TABS: 0.6000 mg | ORAL_TABLET | Freq: Two times a day (BID) | ORAL | 0 refills | Status: DC

## 2021-10-03 MED ORDER — IBUPROFEN 600 MG PO TABS: 600.0000 mg | ORAL_TABLET | Freq: Four times a day (QID) | ORAL | 0 refills | Status: DC | PRN

## 2021-10-03 MED ORDER — TRAMADOL HCL 50 MG PO TABS: 50.0000 mg | ORAL_TABLET | Freq: Four times a day (QID) | ORAL | 0 refills | Status: AC | PRN

## 2021-10-03 MED ORDER — COLCHICINE 0.6 MG PO TABS
1.2000 mg | ORAL_TABLET | Freq: Once | ORAL | Status: AC
  Administered 2021-10-03: 1.2 mg via ORAL
  Filled 2021-10-03: qty 2

## 2021-10-03 MED ORDER — PREDNISONE 20 MG PO TABS: ORAL_TABLET | ORAL | 0 refills | Status: AC

## 2021-10-03 NOTE — ED Provider Notes (Signed)
[facility] Emergency Department Provider Note ____________________________________________   Event Date/Time   First MD Initiated Contact with Patient 10/02/21 1832     (approximate)  I have reviewed the triage vital signs and the nursing notes.   HISTORY  Chief Complaint Flank Pain and Foot Pain    HPI Jose Caldwell is a 35 y.o. male with PMH of gout who presents with left lower back pain over the last 3 days, gradual onset, persistent course, worse with changes in position, and nonradiating.  It is also worse if he coughs.  The patient has not taken anything for it at home.  He denies associated dysuria or hematuria, fever, or any weakness or numbness in the legs.  He states it is similar to pain he had a bit over a month ago for which he was seen in the ED, however that pain was on the other side.  The patient also reports a flare of gout in his left great toe since yesterday.  He denies any trauma.  The patient denies cough, shortness of breath, vomiting or diarrhea, or other acute symptoms.  He had not noted a fever at home.  Past Medical History:  Diagnosis Date   Gout    Kidney infection     There are no problems to display for this patient.   History reviewed. No pertinent surgical history.  Prior to Admission medications   Medication Sig Start Date End Date Taking? Authorizing Provider  ibuprofen (ADVIL) 600 MG tablet Take 1 tablet (600 mg total) by mouth every 6 (six) hours as needed for fever (pain). 10/03/21  Yes Dionne Bucy, MD  predniSONE (DELTASONE) 20 MG tablet Take 3 tablets (60 mg total) by mouth daily with breakfast for 3 days, THEN 2 tablets (40 mg total) daily with breakfast for 3 days, THEN 1 tablet (20 mg total) daily with breakfast for 3 days. 10/03/21 10/12/21 Yes Dionne Bucy, MD  traMADol (ULTRAM) 50 MG tablet Take 1 tablet (50 mg total) by mouth every 6 (six) hours as needed for up to 5 days. 10/03/21 10/08/21 Yes  Dionne Bucy, MD  cephALEXin (KEFLEX) 500 MG capsule Take 1 capsule (500 mg total) by mouth 2 (two) times daily. 08/27/21   Sharyn Creamer, MD  colchicine 0.6 MG tablet Take 1 tablet (0.6 mg total) by mouth 2 (two) times daily for 7 days. 05/14/21 05/21/21  Eber Hong, MD  tiZANidine (ZANAFLEX) 2 MG tablet Take 1-2 tablets (2-4 mg total) by mouth every 6 (six) hours as needed for muscle spasms. 02/27/21   Wieters, Hallie C, PA-C    Allergies Patient has no known allergies.  Family History  Problem Relation Age of Onset   Healthy Mother    Healthy Father     Social History Social History   Tobacco Use   Smoking status: Some Days    Types: Cigars   Smokeless tobacco: Never  Vaping Use   Vaping Use: Never used  Substance Use Topics   Alcohol use: Yes    Alcohol/week: 2.0 standard drinks    Types: 2 Standard drinks or equivalent per week   Drug use: Never    Review of Systems  Constitutional: Positive for fever. Eyes: No redness. ENT: No sore throat or nasal congestion. Cardiovascular: Denies chest pain. Respiratory: Denies cough or shortness of breath. Gastrointestinal: No vomiting or diarrhea.  Genitourinary: Negative for dysuria or hematuria.  Musculoskeletal: Positive for back pain. Skin: Negative for rash. Neurological: Negative for headaches, focal  weakness or numbness.   ____________________________________________   PHYSICAL EXAM:  VITAL SIGNS: ED Triage Vitals  Enc Vitals Group     BP 10/02/21 1834 (!) 135/102     Pulse Rate 10/02/21 1834 (!) 130     Resp 10/02/21 1834 18     Temp 10/02/21 1834 (!) 100.5 F (38.1 C)     Temp Source 10/03/21 0118 Oral     SpO2 10/02/21 1834 97 %     Weight 10/02/21 1835 (!) 315 lb (142.9 kg)     Height 10/02/21 1835 6\' 3"  (1.905 m)     Head Circumference --      Peak Flow --      Pain Score 10/02/21 1835 9     Pain Loc --      Pain Edu? --      Excl. in [place]? --     Constitutional: Alert and oriented. Well  appearing and in no acute distress. Eyes: Conjunctivae are normal.   Head: Atraumatic. Nose: No congestion/rhinnorhea. Mouth/Throat: Mucous membranes are moist.   Neck: Normal range of motion.  Cardiovascular: Normal rate, regular rhythm. Good peripheral circulation. Respiratory: Normal respiratory effort.  No retractions.  Gastrointestinal: Soft and nontender. No distention.  Genitourinary: No CVA tenderness. Musculoskeletal: No lower extremity edema.  Extremities warm and well perfused.  Mild left lumbar paraspinal tenderness.  No midline spinal tenderness.  Area of swelling to the base of the left great toe/MTP joint with very faint erythema.  Mild tenderness.  Good range of motion at the MTP joint.  No erythema, induration, abnormal warmth, or streaking going up the leg.  No tenderness to the calf. Neurologic:  Normal speech and language.  5/5 motor strength and intact sensation of bilateral lower extremities. Skin:  Skin is warm and dry. No rash noted. Psychiatric: Mood and affect are normal. Speech and behavior are normal.  ____________________________________________   LABS (all labs ordered are listed, but only abnormal results are displayed)  Labs Reviewed  CBC - Abnormal; Notable for the following components:      Result Value   WBC 14.3 (*)    All other components within normal limits  BASIC METABOLIC PANEL - Abnormal; Notable for the following components:   Glucose, Bld 108 (*)    All other components within normal limits  URINALYSIS, COMPLETE (UACMP) WITH MICROSCOPIC - Abnormal; Notable for the following components:   Color, Urine YELLOW (*)    APPearance CLEAR (*)    Hgb urine dipstick MODERATE (*)    Ketones, ur 80 (*)    All other components within normal limits  HEPATIC FUNCTION PANEL - Abnormal; Notable for the following components:   Total Bilirubin 1.4 (*)    Indirect Bilirubin 1.2 (*)    All other components within normal limits  RESP PANEL BY RT-PCR (FLU  A&B, COVID) ARPGX2  URINE CULTURE  CULTURE, BLOOD (ROUTINE X 2)  CULTURE, BLOOD (ROUTINE X 2)  LACTIC ACID, PLASMA  LACTIC ACID, PLASMA  URIC ACID  LIPASE, BLOOD   ____________________________________________  EKG   ____________________________________________  RADIOLOGY  XR lumbar spine: IMPRESSION:  1. Mild multilevel degenerative disc disease. No acute abnormality.  2. Six non-rib-bearing lumbar vertebral bodies.   CT abdomen/pelvis:  IMPRESSION:  No evidence of pyelonephritis on CT.     Additional ancillary findings as above.   ____________________________________________   PROCEDURES  Procedure(s) performed: No  Procedures  Critical Care performed: No ____________________________________________   INITIAL IMPRESSION / ASSESSMENT AND PLAN /  ED COURSE  Pertinent labs & imaging results that were available during my care of the patient were reviewed by me and considered in my medical decision making (see chart for details).   35 year old male with PMH of gout presents with left lower back pain over the last 3 days with no significant associated symptoms as well as swelling to the base of his left great toe which is consistent with prior gout flares.  The patient had not noted a fever at home.  I reviewed the past medical records in [place].  The patient was seen today at the urgent care for this pain and was referred to the ED.  He reported hematuria at that visit and stated it had been present for several weeks, but denies hematuria to me.  The patient was previously seen in the ED here on 10/27 with right lower back pain.  CT (without contrast) at that time showed possible left-sided pyelonephritis and the patient was treated empirically with antibiotics, however he had no urinary symptoms and his urine culture subsequently showed no growth.  On exam the patient is overall very well-appearing.  He was febrile at triage and tachycardic.  He has mild left lumbar  paraspinal tenderness but no midline tenderness, a normal neurologic exam, and very mild erythema and swelling to the base of the left great toe consistent with gout.  Lab work-up obtained from triage is notable for leukocytosis but a normal chemistry and lactate.  Respiratory panel is negative.  Urinalysis shows ketones but is otherwise negative for any findings of infection.  Lactate is normal.  Overall etiology of the patient's symptoms is unclear.  I doubt pyelonephritis given the lack of urinary symptoms, negative urinalysis and reproducible nature of the pain, however there is no other clear source of infection or explanation for the patient's fever.  There is no evidence of cellulitis or septic arthritis at the left great toe (given good range of motion and very mild swelling and erythema).  There is no evidence of discitis or other spinal etiology given the acute onset of the pain, the reproducibility and location well lateral of the spine, the normal neuro exam, and the lack of any specific risk factors (the patient denies IV drug use).  Respiratory panel is negative and there is no clinical evidence for pneumonia or GI etiology.  The fever and elevated WBC count may be reactive related to gout.  It is also possible that the patient has the beginning of flu or other viral syndrome that is not being picked up by the respiratory panel yet.  We will give fluids, Toradol, colchicine, and obtain a CT with contrast for further evaluation of the kidney and other intra-abdominal structures.  ----------------------------------------- 4:17 AM on 10/03/2021 -----------------------------------------  CT shows no acute abnormalities.  Because of a note in the patient's prior chart that he had elevated LFTs on prior labs, I obtained hepatic function panel and lipase, both of which are unremarkable except for minimally elevated bilirubin.  The patient has no right upper quadrant pain, jaundice, or other  hepatobiliary symptoms.  Overall the work-up is very reassuring, and the patient appears well.  His vital signs have normalized.  Although the source of the fever is unclear, the patient has no other symptoms to suggest a possible source outside of what we have checked.  I suspect that the fever and white count may just be reactive and due to the gout.  I added on blood cultures and urine culture,  however my suspicion for significant infection is very low.  I had an extensive discussion with the patient about the results of the work-up, the likely etiologies of his symptoms, the fever of unknown origin, and they follow-up plan.  He feels well and would like to go home.  I answered all of his questions and gave the return precautions and he expressed understanding.  I will prescribe prednisone for the gout flare as well as ibuprofen and tramadol for pain.  ____________________________________________   FINAL CLINICAL IMPRESSION(S) / ED DIAGNOSES  Final diagnoses:  Acute left-sided low back pain without sciatica  Acute gout involving toe of left foot, unspecified cause  Febrile illness      NEW MEDICATIONS STARTED DURING THIS VISIT:  New Prescriptions   IBUPROFEN (ADVIL) 600 MG TABLET    Take 1 tablet (600 mg total) by mouth every 6 (six) hours as needed for fever (pain).   PREDNISONE (DELTASONE) 20 MG TABLET    Take 3 tablets (60 mg total) by mouth daily with breakfast for 3 days, THEN 2 tablets (40 mg total) daily with breakfast for 3 days, THEN 1 tablet (20 mg total) daily with breakfast for 3 days.   TRAMADOL (ULTRAM) 50 MG TABLET    Take 1 tablet (50 mg total) by mouth every 6 (six) hours as needed for up to 5 days.     Note:  This document was prepared using Dragon voice recognition software and may include unintentional dictation errors.    Arta Silence, MD 10/03/21 [phone]

## 2021-10-03 NOTE — Discharge Instructions (Addendum)
Take the prednisone as prescribed and finish the full 9-day course.  This will decrease inflammation and help with the gout pain.  Take the ibuprofen up to every 6 hours with meals as the primary pain medication.  You may take the tramadol on top of this for breakthrough pain.  Return to the ER for new, worsening, or persistent severe back, toe, foot, or leg pain, weakness or numbness, worsening swelling in the toe, continued or worsening fever, chest pain or difficulty breathing, vomiting, difficulty urinating, or any other new or worsening symptoms that concern you.

## 2021-10-04 LAB — URINE CULTURE: Culture: NO GROWTH

## 2021-10-08 LAB — CULTURE, BLOOD (ROUTINE X 2)
Culture: NO GROWTH
Culture: NO GROWTH
Special Requests: ADEQUATE

## 2021-12-23 ENCOUNTER — Ambulatory Visit: Payer: 59 | Admitting: Emergency Medicine

## 2022-04-07 DIAGNOSIS — M109 Gout, unspecified: Secondary | ICD-10-CM | POA: Insufficient documentation

## 2022-04-07 DIAGNOSIS — I10 Essential (primary) hypertension: Secondary | ICD-10-CM | POA: Insufficient documentation

## 2022-04-29 ENCOUNTER — Other Ambulatory Visit: Payer: Self-pay | Admitting: Nurse Practitioner

## 2022-04-29 ENCOUNTER — Ambulatory Visit: Payer: 59 | Admitting: Nurse Practitioner

## 2022-04-29 ENCOUNTER — Encounter: Payer: Self-pay | Admitting: Nurse Practitioner

## 2022-04-29 VITALS — BP 142/90 | HR 95 | Temp 98.6°F | Ht 75.0 in | Wt 313.0 lb

## 2022-04-29 DIAGNOSIS — M109 Gout, unspecified: Secondary | ICD-10-CM

## 2022-04-29 DIAGNOSIS — I10 Essential (primary) hypertension: Secondary | ICD-10-CM

## 2022-04-29 DIAGNOSIS — Z6839 Body mass index (BMI) 39.0-39.9, adult: Secondary | ICD-10-CM

## 2022-04-29 DIAGNOSIS — Z8659 Personal history of other mental and behavioral disorders: Secondary | ICD-10-CM

## 2022-04-29 LAB — CBC
HCT: 41.6 % (ref 39.0–52.0)
Hemoglobin: 13.5 g/dL (ref 13.0–17.0)
MCHC: 32.4 g/dL (ref 30.0–36.0)
MCV: 84 fl (ref 78.0–100.0)
Platelets: 205 10*3/uL (ref 150.0–400.0)
RBC: 4.95 Mil/uL (ref 4.22–5.81)
RDW: 13.4 % (ref 11.5–15.5)
WBC: 6.4 10*3/uL (ref 4.0–10.5)

## 2022-04-29 LAB — COMPREHENSIVE METABOLIC PANEL
ALT: 21 U/L (ref 0–53)
AST: 16 U/L (ref 0–37)
Albumin: 4.6 g/dL (ref 3.5–5.2)
Alkaline Phosphatase: 65 U/L (ref 39–117)
BUN: 10 mg/dL (ref 6–23)
CO2: 29 mEq/L (ref 19–32)
Calcium: 9.3 mg/dL (ref 8.4–10.5)
Chloride: 106 mEq/L (ref 96–112)
Creatinine, Ser: 0.9 mg/dL (ref 0.40–1.50)
GFR: 111.02 mL/min (ref >60.00)
Glucose, Bld: 100 mg/dL — ABNORMAL HIGH (ref 70–99)
Potassium: 4 mEq/L (ref 3.5–5.1)
Sodium: 141 mEq/L (ref 135–145)
Total Bilirubin: 0.4 mg/dL (ref 0.2–1.2)
Total Protein: 7.1 g/dL (ref 6.0–8.3)

## 2022-04-29 LAB — LIPID PANEL
Cholesterol: 164 mg/dL (ref 0–200)
HDL: 53 mg/dL (ref >39.00)
LDL Cholesterol: 99 mg/dL (ref 0–99)
NonHDL: 111.09
Total CHOL/HDL Ratio: 3
Triglycerides: 61 mg/dL (ref 0.0–149.0)
VLDL: 12.2 mg/dL (ref 0.0–40.0)

## 2022-04-29 LAB — TSH: TSH: 0.69 u[IU]/mL (ref 0.35–5.50)

## 2022-04-29 LAB — HEMOGLOBIN A1C: Hgb A1c MFr Bld: 5.9 % (ref 4.6–6.5)

## 2022-04-29 MED ORDER — OLMESARTAN MEDOXOMIL 20 MG PO TABS: 20.0000 mg | ORAL_TABLET | Freq: Every day | ORAL | 1 refills | Status: DC

## 2022-04-29 MED ORDER — COLCHICINE 0.6 MG PO TABS: 0.6000 mg | ORAL_TABLET | Freq: Two times a day (BID) | ORAL | 0 refills | Status: DC

## 2022-04-29 NOTE — Assessment & Plan Note (Signed)
History of gout. Will refill colchicine based on kidney function. FU 1 month

## 2022-04-29 NOTE — Patient Instructions (Signed)
[place] attention specialist:  Address: [address] [place], [place], [place] [address] Phone: [phone]

## 2022-04-29 NOTE — Progress Notes (Signed)
New Patient Office Visit  Subjective    Patient ID: Jose Caldwell, male    DOB: Apr 23, 1986  Age: 36 y.o. MRN: [id number]  CC:  Chief Complaint  Patient presents with   New patient    No concerns    HPI Jose Caldwell, 36 year old male presents to establish care at this practice. He has past medical history of OSA, HTN, and Gout.  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 He takes colchicine PRN for gouty flares, he is requesting refill today as well.   Outpatient Encounter Medications as of 04/29/2022  Medication Sig   ibuprofen (ADVIL) 600 MG tablet Take 1 tablet (600 mg total) by mouth every 6 (six) hours as needed for fever (pain).   olmesartan (BENICAR) 20 MG tablet Take 20 mg by mouth daily.   cephALEXin (KEFLEX) 500 MG capsule Take 1 capsule (500 mg total) by mouth 2 (two) times daily. (Patient not taking: Reported on 04/29/2022)   colchicine 0.6 MG tablet Take 1 tablet (0.6 mg total) by mouth 2 (two) times daily for 7 days.   colchicine 0.6 MG tablet Take 1 tablet (0.6 mg total) by mouth 2 (two) times daily for 7 days.   tiZANidine (ZANAFLEX) 2 MG tablet Take 1-2 tablets (2-4 mg total) by mouth every 6 (six) hours as needed for muscle spasms. (Patient not taking: Reported on 04/29/2022)   No facility-administered encounter medications on file as of 04/29/2022.    Past Medical History:  Diagnosis Date   Gout    Hypertension    Kidney infection    Sleep apnea     No past surgical history on file.  Family History  Problem Relation Age of Onset   Healthy Mother    Hypertension Mother    Diabetes Mellitus II Father    Healthy Sister    Hypertension Brother     Social History   Socioeconomic History   Marital status: Unknown    Spouse name: Not on file   Number of children: Not on file   Years of education: Not on file   Highest education  level: Not on file  Occupational History   Not on file  Tobacco Use   Smoking status: Some Days    Types: Cigars   Smokeless tobacco: Never  Vaping Use   Vaping Use: Never used  Substance and Sexual Activity   Alcohol use: Yes    Alcohol/week: 2.0 standard drinks of alcohol    Types: 2 Standard drinks or equivalent per week   Drug use: Never   Sexual activity: Not on file  Other Topics Concern   Not on file  Social History Narrative   Not on file   Social Determinants of Health   Financial Resource Strain: Not on file  Food Insecurity: Not on file  Transportation Needs: Not on file  Physical Activity: Not on file  Stress: Not on file  Social Connections: Not on file  Intimate Partner Violence: Not on file    Review of Systems  Constitutional:  Negative for chills, fever and weight loss.  HENT:  Negative for congestion, sinus pain and sore throat.   Eyes:  Negative for blurred vision and double vision.  Respiratory:  Negative for cough and shortness of breath.   Cardiovascular:  Negative for chest pain.  Gastrointestinal:  Negative for constipation, diarrhea,  nausea and vomiting.  Genitourinary:  Negative for frequency.  Neurological:  Negative for headaches.  Psychiatric/Behavioral:  Negative for depression. The patient has insomnia. The patient is not nervous/anxious.         Objective    BP (!) 142/90 (BP Location: Left Arm, Patient Position: Sitting, Cuff Size: Large)   Pulse 95   Temp 98.6 F (37 C) (Oral)   Ht 6\' 3"  (1.905 m)   Wt (!) 313 lb (142 kg)   SpO2 99%   BMI 39.12 kg/m   Physical Exam HENT:     Head: Normocephalic.  Cardiovascular:     Rate and Rhythm: Normal rate and regular rhythm.     Heart sounds: Normal heart sounds.  Pulmonary:     Effort: Pulmonary effort is normal.  Skin:    General: Skin is warm.  Neurological:     Mental Status: He is alert and oriented to person, place, and time.         Assessment & Plan:    Problem List Items Addressed This Visit       Cardiovascular and Mediastinum   Hypertensive disorder    Chronic, labs ordered. Olmesartan refilled. Educated on lifestyle changes and monitoring his BP and we will recheck in 1 month.      Relevant Medications   olmesartan (BENICAR) 20 MG tablet   Other Relevant Orders   TSH   Hemoglobin A1c   Lipid panel   Comprehensive metabolic panel   CBC   Ambulatory referral to Psychiatry     Other   Gout    History of gout. Will refill colchicine based on kidney function. FU 1 month      History of ADHD - Primary    Referral made to attention specialist for ADHD. Patient may benefit from Provigil based on his need for shift work and OSA. Will discuss at his next visit.       Relevant Orders   TSH   Hemoglobin A1c   Lipid panel   Comprehensive metabolic panel   CBC   Ambulatory referral to Psychiatry   Class 2 severe obesity with serious comorbidity and body mass index (BMI) of 39.0 to 39.9 in adult [facility])    Labs ordered. Will discuss at next visit      Relevant Orders   TSH   Hemoglobin A1c   Lipid panel   Comprehensive metabolic panel   CBC   Ambulatory referral to Psychiatry    Return in about 1 month (around 05/29/2022) for follw-up with 05/31/2022.   Maralyn Sago, RN

## 2022-04-29 NOTE — Assessment & Plan Note (Addendum)
Chronic, labs ordered. Olmesartan refill pending CMP results. Educated on lifestyle changes and monitoring his BP and we will recheck in 1 month.

## 2022-04-29 NOTE — Assessment & Plan Note (Signed)
Labs ordered. Will discuss at next visit

## 2022-04-29 NOTE — Progress Notes (Signed)
Estimated Creatinine Clearance: 174.2 mL/min (by C-G formula based on SCr of 0.9 mg/dL).

## 2022-04-29 NOTE — Assessment & Plan Note (Signed)
Referral made to [place] attention specialist for ADHD. Patient may benefit from Provigil based on his need for shift work and OSA. Will discuss at his next visit.

## 2022-06-03 ENCOUNTER — Ambulatory Visit: Payer: 59 | Admitting: Nurse Practitioner

## 2023-02-08 ENCOUNTER — Encounter: Payer: Self-pay | Admitting: Emergency Medicine

## 2023-02-08 ENCOUNTER — Other Ambulatory Visit: Payer: Self-pay

## 2023-02-08 ENCOUNTER — Emergency Department: Payer: 59

## 2023-02-08 ENCOUNTER — Emergency Department
Admission: EM | Admit: 2023-02-08 | Discharge: 2023-02-08 | Disposition: A | Discharge status: Home / Self Care | Payer: 59 | Attending: Emergency Medicine | Admitting: Emergency Medicine

## 2023-02-08 DIAGNOSIS — M109 Gout, unspecified: Secondary | ICD-10-CM

## 2023-02-08 DIAGNOSIS — M25532 Pain in left wrist: Secondary | ICD-10-CM | POA: Insufficient documentation

## 2023-02-08 MED ORDER — COLCHICINE 0.6 MG PO TABS: 0.6000 mg | ORAL_TABLET | Freq: Two times a day (BID) | ORAL | 0 refills | Status: DC

## 2023-02-08 MED ORDER — PREDNISONE 50 MG PO TABS: ORAL_TABLET | ORAL | 0 refills | Status: DC

## 2023-02-08 MED ORDER — KETOROLAC TROMETHAMINE 30 MG/ML IJ SOLN
30.0000 mg | Freq: Once | INTRAMUSCULAR | Status: AC
  Administered 2023-02-08: 30 mg via INTRAMUSCULAR
  Filled 2023-02-08: qty 1

## 2023-02-08 NOTE — ED Provider Notes (Signed)
[facility] Provider Note  Patient Contact: 6:43 PM (approximate)   History   Wrist Pain   HPI  Jose Caldwell is a 37 y.o. male with a history of gout, presents to the emergency department with acute left wrist pain and redness.  Patient reports that his current pain feels similar to gout flares in the past.  Reports that he has had more alcohol than normal.  He is requesting an injection of Toradol and prednisone as he reports that this has helped his pain in the past.      Physical Exam   Triage Vital Signs: ED Triage Vitals  Enc Vitals Group     BP 02/08/23 1540 (!) 161/115     Pulse Rate 02/08/23 1540 (!) 121     Resp 02/08/23 1540 18     Temp 02/08/23 1540 98.8 F (37.1 C)     Temp Source 02/08/23 1540 Oral     SpO2 02/08/23 1540 97 %     Weight --      Height --      Head Circumference --      Peak Flow --      Pain Score 02/08/23 1539 10     Pain Loc --      Pain Edu? --      Excl. in GC? --     Most recent vital signs: Vitals:   02/08/23 1540  BP: (!) 161/115  Pulse: (!) 121  Resp: 18  Temp: 98.8 F (37.1 C)  SpO2: 97%     General: Alert and in no acute distress. Eyes:  PERRL. EOMI. Head: No acute traumatic findings ENT:      Nose: No congestion/rhinnorhea.      Mouth/Throat: Mucous membranes are moist. Neck: No stridor. No cervical spine tenderness to palpation. Cardiovascular:  Good peripheral perfusion Respiratory: Normal respiratory effort without tachypnea or retractions. Lungs CTAB. Good air entry to the bases with no decreased or absent breath sounds. Gastrointestinal: Bowel sounds 4 quadrants. Soft and nontender to palpation. No guarding or rigidity. No palpable masses. No distention. No CVA tenderness. Musculoskeletal: Full range of motion to all extremities.  Neurologic:  No gross focal neurologic deficits are appreciated.  Skin: Patient has erythema of left wrist.    ED Results / Procedures / Treatments    Labs (all labs ordered are listed, but only abnormal results are displayed) Labs Reviewed - No data to display      RADIOLOGY  I personally viewed and evaluated these images as part of my medical decision making, as well as reviewing the written report by the radiologist.  ED Provider Interpretation: No acute bony abnormality.    PROCEDURES:  Critical Care performed: No  Procedures   MEDICATIONS ORDERED IN ED: Medications  ketorolac (TORADOL) 30 MG/ML injection 30 mg (has no administration in time range)     IMPRESSION / MDM / ASSESSMENT AND PLAN / ED COURSE  I reviewed the triage vital signs and the nursing notes.                              Assessment and plan Left wrist pain 37 year old male presents to the emergency department with acute left wrist pain similar to gout denies.  Patient was given injection of Toradol and was prescribed a 5-day burst of prednisone.  He was also given a refill of his colchicine.      FINAL CLINICAL  IMPRESSION(S) / ED DIAGNOSES   Final diagnoses:  Left wrist pain     Rx / DC Orders   ED Discharge Orders          Ordered    predniSONE (DELTASONE) 50 MG tablet        02/08/23 1835             Note:  This document was prepared using Dragon voice recognition software and may include unintentional dictation errors.   Pia [place], PA-C 02/08/23 1847    Merwyn Katos, MD 02/08/23 [phone]

## 2023-02-08 NOTE — Discharge Instructions (Signed)
Take one tablet daily for the next five days.  

## 2023-02-08 NOTE — ED Triage Notes (Signed)
Patient to ED via POV for left wrist pain. Patient states he was lifting a dresser and have worsening pain since Sunday.

## 2023-06-23 ENCOUNTER — Encounter (HOSPITAL_COMMUNITY): Payer: Self-pay | Admitting: *Deleted

## 2023-06-23 ENCOUNTER — Ambulatory Visit (HOSPITAL_COMMUNITY)
Admission: EM | Admit: 2023-06-23 | Discharge: 2023-06-23 | Disposition: A | Discharge status: Home / Self Care | Payer: 59 | Attending: Family Medicine | Admitting: Family Medicine

## 2023-06-23 DIAGNOSIS — I10 Essential (primary) hypertension: Secondary | ICD-10-CM

## 2023-06-23 DIAGNOSIS — M109 Gout, unspecified: Secondary | ICD-10-CM

## 2023-06-23 MED ORDER — COLCHICINE 0.6 MG PO TABS: 0.6000 mg | ORAL_TABLET | Freq: Two times a day (BID) | ORAL | 1 refills | Status: DC

## 2023-06-23 MED ORDER — OLMESARTAN MEDOXOMIL 20 MG PO TABS: 20.0000 mg | ORAL_TABLET | Freq: Every day | ORAL | 1 refills | Status: DC

## 2023-06-23 MED ORDER — PREDNISONE 20 MG PO TABS: 40.0000 mg | ORAL_TABLET | Freq: Every day | ORAL | 0 refills | Status: AC

## 2023-06-23 NOTE — ED Triage Notes (Signed)
Pt states he is having a gout flare in his left foot x 3 days. He took some advil 30 mins prior to arriving.   Pt states he is just starting a new job and waiting for his new insurance to go into effect before he can see his PCP, so he would like to know if he can get refills on any meds given for like 2 months.

## 2023-06-23 NOTE — Discharge Instructions (Signed)
Take prednisone 20 mg--2 daily for 5 days  Colchicine 0.6 mg--1 tablet daily as needed for gout pain  Benicar prescription is printed for you to use when you are running out of your current supply.  Please do follow-up with primary care when you are able

## 2023-06-23 NOTE — ED Provider Notes (Signed)
[facility]    CSN: [id number] Arrival date & time: 06/23/23  1846      History   Chief Complaint Chief Complaint  Patient presents with   Foot Pain    HPI Jose Caldwell is a 37 y.o. male.    Foot Pain  Here for left foot pain and swelling.  Started bothering him about 3 to 4 days ago.  He does have a history of gout and this is similar to his previous exacerbations.  He states prednisone and colchicine usually do well for his exacerbations.  He also takes Benicar for blood pressure.    He is in between insurances and the will not have insurance to cover medications and doctor visits for about 2 months more.   Past Medical History:  Diagnosis Date   Gout    Hypertension    Kidney infection    Sleep apnea     Patient Active Problem List   Diagnosis Date Noted   History of ADHD 04/29/2022   Class 2 severe obesity with serious comorbidity and body mass index (BMI) of 39.0 to 39.9 in adult [facility]) 04/29/2022   Gout 04/07/2022   Hypertensive disorder 04/07/2022    History reviewed. No pertinent surgical history.     Home Medications    Prior to Admission medications   Medication Sig Start Date End Date Taking? Authorizing Provider  predniSONE (DELTASONE) 20 MG tablet Take 2 tablets (40 mg total) by mouth daily with breakfast for 5 days. 06/23/23 06/28/23 Yes Zenia Resides, MD  colchicine 0.6 MG tablet Take 1 tablet (0.6 mg total) by mouth 2 (two) times daily. 06/23/23   Zenia Resides, MD  olmesartan (BENICAR) 20 MG tablet Take 1 tablet (20 mg total) by mouth daily. 06/23/23   Zenia Resides, MD    Family History Family History  Problem Relation Age of Onset   Healthy Mother    Hypertension Mother    Diabetes Mellitus II Father    Healthy Sister    Hypertension Brother     Social History Social History   Tobacco Use   Smoking status: Some Days    Types: Cigars   Smokeless tobacco: Never  Vaping Use   Vaping status: Never Used   Substance Use Topics   Alcohol use: Yes    Alcohol/week: 2.0 standard drinks of alcohol    Types: 2 Standard drinks or equivalent per week   Drug use: Never     Allergies   Patient has no known allergies.   Review of Systems Review of Systems   Physical Exam Triage Vital Signs ED Triage Vitals  Encounter Vitals Group     BP 06/23/23 1904 (!) 161/98     Systolic BP Percentile --      Diastolic BP Percentile --      Pulse Rate 06/23/23 1904 80     Resp 06/23/23 1904 18     Temp 06/23/23 1904 98.9 F (37.2 C)     Temp Source 06/23/23 1904 Oral     SpO2 06/23/23 1904 98 %     Weight --      Height --      Head Circumference --      Peak Flow --      Pain Score 06/23/23 1901 10     Pain Loc --      Pain Education --      Exclude from Growth Chart --    No data found.  Updated Vital Signs BP (!) 161/98 (BP Location: Right Arm)   Pulse 80   Temp 98.9 F (37.2 C) (Oral)   Resp 18   SpO2 98%   Visual Acuity Right Eye Distance:   Left Eye Distance:   Bilateral Distance:    Right Eye Near:   Left Eye Near:    Bilateral Near:     Physical Exam Vitals reviewed.  Constitutional:      General: He is not in acute distress.    Appearance: He is not ill-appearing, toxic-appearing or diaphoretic.  Cardiovascular:     Rate and Rhythm: Normal rate and regular rhythm.  Pulmonary:     Effort: Pulmonary effort is normal.     Breath sounds: Normal breath sounds.  Musculoskeletal:     Comments: There is pronounced swelling of his left foot and tenderness of the first MTP and entire left foot.  There is no erythema.  Pulses are 2+ in the dorsalis pedis and the posterior tibial areas  Skin:    Coloration: Skin is not pale.  Neurological:     General: No focal deficit present.     Mental Status: He is alert and oriented to person, place, and time.      UC Treatments / Results  Labs (all labs ordered are listed, but only abnormal results are displayed) Labs  Reviewed - No data to display  EKG   Radiology No results found.  Procedures Procedures (including critical care time)  Medications Ordered in UC Medications - No data to display  Initial Impression / Assessment and Plan / UC Course  I have reviewed the triage vital signs and the nursing notes.  Pertinent labs & imaging results that were available during my care of the patient were reviewed by me and considered in my medical decision making (see chart for details).       Prescription for Benicar is printed for him to use when his current supply is out.    Prednisone and colchicine are sent in for the gout exacerbation Final Clinical Impressions(s) / UC Diagnoses   Final diagnoses:  Exacerbation of gout  Essential hypertension     Discharge Instructions      Take prednisone 20 mg--2 daily for 5 days  Colchicine 0.6 mg--1 tablet daily as needed for gout pain  Benicar prescription is printed for you to use when you are running out of your current supply.  Please do follow-up with primary care when you are able     ED Prescriptions     Medication Sig Dispense Auth. Provider   colchicine 0.6 MG tablet Take 1 tablet (0.6 mg total) by mouth 2 (two) times daily. 60 tablet Kerri Asche, Janace Aris, MD   predniSONE (DELTASONE) 20 MG tablet Take 2 tablets (40 mg total) by mouth daily with breakfast for 5 days. 10 tablet Zenia Resides, MD   olmesartan (BENICAR) 20 MG tablet Take 1 tablet (20 mg total) by mouth daily. 30 tablet Kalina Morabito, Janace Aris, MD      I have reviewed the PDMP during this encounter.   Zenia Resides, MD 06/23/23 [phone]

## 2023-11-26 ENCOUNTER — Ambulatory Visit: Admission: EM | Admit: 2023-11-26 | Discharge: 2023-11-26 | Discharge status: Against Medical Advice | Payer: Self-pay

## 2023-11-26 NOTE — ED Notes (Signed)
Pt left before being registered, right after checking in

## 2023-11-28 ENCOUNTER — Emergency Department
Admission: EM | Admit: 2023-11-28 | Discharge: 2023-11-28 | Disposition: A | Discharge status: Home / Self Care | Payer: Managed Care, Other (non HMO) | Attending: Emergency Medicine | Admitting: Emergency Medicine

## 2023-11-28 ENCOUNTER — Other Ambulatory Visit: Payer: Self-pay

## 2023-11-28 DIAGNOSIS — M545 Low back pain, unspecified: Secondary | ICD-10-CM | POA: Diagnosis present

## 2023-11-28 DIAGNOSIS — M5442 Lumbago with sciatica, left side: Secondary | ICD-10-CM | POA: Insufficient documentation

## 2023-11-28 DIAGNOSIS — M5432 Sciatica, left side: Secondary | ICD-10-CM

## 2023-11-28 MED ORDER — GABAPENTIN 300 MG PO CAPS: 300.0000 mg | ORAL_CAPSULE | Freq: Three times a day (TID) | ORAL | 0 refills | Status: DC | PRN

## 2023-11-28 NOTE — ED Provider Notes (Signed)
   [facility] Provider Note    Event Date/Time   First MD Initiated Contact with Patient 11/28/23 [phone]     (approximate)   History   Leg Pain   HPI  Jose Caldwell is a 38 y.o. male  who presents to the emergency department today because of concern for continued left lower back and leg pain. Symptoms started a few days ago. Patient states he was at work exerting himself when the pain started. Went to urgent care two days ago and was diagnosed with sciatica. Started on steroid and muscle relaxer. States the pain has not improved. Denies history of similar pain in the past.        Physical Exam   Triage Vital Signs: ED Triage Vitals  Encounter Vitals Group     BP 11/28/23 0720 (!) 164/115     Systolic BP Percentile --      Diastolic BP Percentile --      Pulse Rate 11/28/23 0720 92     Resp 11/28/23 0720 15     Temp 11/28/23 0720 98.7 F (37.1 C)     Temp Source 11/28/23 0720 Oral     SpO2 11/28/23 0720 98 %     Weight 11/28/23 0718 300 lb (136.1 kg)     Height 11/28/23 0718 6\' 3"  (1.905 m)     Head Circumference --      Peak Flow --      Pain Score 11/28/23 0718 8     Pain Loc --      Pain Education --      Exclude from Growth Chart --     Most recent vital signs: Vitals:   11/28/23 0720  BP: (!) 164/115  Pulse: 92  Resp: 15  Temp: 98.7 F (37.1 C)  SpO2: 98%   General: Awake, alert, oriented. CV:  Good peripheral perfusion.  Resp:  Normal effort.  Abd:  No distention.  Other:  No midline spinal tenderness.   ED Results / Procedures / Treatments   Labs (all labs ordered are listed, but only abnormal results are displayed) Labs Reviewed - No data to display   EKG  None   RADIOLOGY None   PROCEDURES:  Critical Care performed: No   MEDICATIONS ORDERED IN ED: Medications - No data to display   IMPRESSION / MDM / ASSESSMENT AND PLAN / ED COURSE  I reviewed the triage vital signs and the nursing notes.                               Differential diagnosis includes, but is not limited to, sciatica, disc disease, muscle sprain  Patient's presentation is most consistent with acute presentation with potential threat to life or bodily function.  Patient presented to the emergency department today because of concern for continued left lower back pain and leg pain. The patient was diagnosed with sciatica two days ago and started on steroids and muscle relaxer. Do think likely sciatica. Will add on gabapentin for pain control. Discussed sedating property. Additionally will give patient exercises to help with sciatica.       FINAL CLINICAL IMPRESSION(S) / ED DIAGNOSES   Final diagnoses:  Sciatica of left side       Note:  This document was prepared using Dragon voice recognition software and may include unintentional dictation errors.    Phineas Semen, MD 11/28/23 [phone]

## 2023-11-28 NOTE — Discharge Instructions (Signed)
As discussed the gabapentin (pain medication) can be sedating, so please do not drive or put yourself or others in a position that could be dangerous if you were to be less alert.

## 2023-11-28 NOTE — ED Triage Notes (Addendum)
Patient reports pain that begin near his buttocks and shoots down his left leg. States this has not happened before. Started yesterday. Patient states he was moving an object at work when it started. 8/10 pain. Patient was seen recently for same symptoms and given medication. States they are not working.

## 2023-12-30 ENCOUNTER — Emergency Department
Admission: EM | Admit: 2023-12-30 | Discharge: 2023-12-30 | Disposition: A | Discharge status: Home / Self Care | Payer: Managed Care, Other (non HMO) | Attending: Emergency Medicine | Admitting: Emergency Medicine

## 2023-12-30 ENCOUNTER — Emergency Department: Payer: Managed Care, Other (non HMO)

## 2023-12-30 ENCOUNTER — Other Ambulatory Visit: Payer: Self-pay

## 2023-12-30 DIAGNOSIS — M5442 Lumbago with sciatica, left side: Secondary | ICD-10-CM | POA: Insufficient documentation

## 2023-12-30 DIAGNOSIS — M5432 Sciatica, left side: Secondary | ICD-10-CM

## 2023-12-30 DIAGNOSIS — Z760 Encounter for issue of repeat prescription: Secondary | ICD-10-CM | POA: Diagnosis not present

## 2023-12-30 DIAGNOSIS — M545 Low back pain, unspecified: Secondary | ICD-10-CM | POA: Diagnosis present

## 2023-12-30 DIAGNOSIS — I10 Essential (primary) hypertension: Secondary | ICD-10-CM | POA: Insufficient documentation

## 2023-12-30 DIAGNOSIS — M109 Gout, unspecified: Secondary | ICD-10-CM

## 2023-12-30 MED ORDER — COLCHICINE 0.6 MG PO TABS: 0.6000 mg | ORAL_TABLET | Freq: Two times a day (BID) | ORAL | 1 refills | Status: AC

## 2023-12-30 MED ORDER — PREDNISONE 10 MG (21) PO TBPK: ORAL_TABLET | ORAL | 1 refills | Status: DC

## 2023-12-30 MED ORDER — OLMESARTAN MEDOXOMIL 20 MG PO TABS: 20.0000 mg | ORAL_TABLET | Freq: Every day | ORAL | 3 refills | Status: DC

## 2023-12-30 MED ORDER — DEXAMETHASONE SODIUM PHOSPHATE 10 MG/ML IJ SOLN
10.0000 mg | Freq: Once | INTRAMUSCULAR | Status: AC
Start: 2023-12-30 — End: 2023-12-30
  Administered 2023-12-30: 10 mg via INTRAMUSCULAR
  Filled 2023-12-30: qty 1

## 2023-12-30 MED ORDER — OXYCODONE-ACETAMINOPHEN 7.5-325 MG PO TABS
1.0000 | ORAL_TABLET | ORAL | 0 refills | Status: DC | PRN
Start: 2023-12-30 — End: 2024-05-07

## 2023-12-30 NOTE — ED Triage Notes (Signed)
 Left side lower back, sciatic pain-- pain shoots all the way down left leg.  STates pain has been ongoing x 1 month. SEen through ED .

## 2023-12-30 NOTE — ED Notes (Signed)
 Patient taken to imaging.

## 2023-12-30 NOTE — Discharge Instructions (Addendum)
 Start the prednisone pack tomorrow unless your pain is significantly better. You will have a refill to take on your trip.  Take the pain medication sparingly and do not drive or operate machinery for at least 8 hours after taking it.  Return to the ER if you develop chest pain, shortness of breath, or weakness. This may mean your blood pressure is too high and your body is no longer tolerating it well.  Schedule an appointment with primary care. Someone should call you to help find someone taking new patients.

## 2024-01-01 NOTE — ED Provider Notes (Signed)
 [facility] Provider Note    Event Date/Time   First MD Initiated Contact with Patient 12/30/23 1136     (approximate)   History   Back Pain   HPI  Jose Caldwell is a 38 y.o. male with history of gout, hypertension, sciatica and as listed in EMR presents to the emergency department for treatment and evaluation of the left side lower back pain that radiates down to the foot.  Pain has been intermittent for about a month but over the past several days has become persistent.  He denies known injury.  He was evaluated for the same about a month ago and given pain medication which helped some but did not completely relieve his symptoms.       Physical Exam   Triage Vital Signs: ED Triage Vitals  Encounter Vitals Group     BP 12/30/23 1047 (!) 170/128     Systolic BP Percentile --      Diastolic BP Percentile --      Pulse Rate 12/30/23 1046 (!) 101     Resp 12/30/23 1046 16     Temp 12/30/23 1046 98.2 F (36.8 C)     Temp Source 12/30/23 1046 Oral     SpO2 12/30/23 1046 100 %     Weight 12/30/23 1045 (!) 300 lb 0.7 oz (136.1 kg)     Height --      Head Circumference --      Peak Flow --      Pain Score 12/30/23 1045 8     Pain Loc --      Pain Education --      Exclude from Growth Chart --     Most recent vital signs: Vitals:   12/30/23 1047 12/30/23 1201  BP: (!) 170/128 (!) 168/128  Pulse:  89  Resp:  14  Temp:    SpO2:  100%    General: Awake, no distress.  CV:  Good peripheral perfusion.  Resp:  Normal effort.  Abd:  No distention.  Other:  Positive straight leg raise on the left left side   ED Results / Procedures / Treatments   Labs (all labs ordered are listed, but only abnormal results are displayed) Labs Reviewed - No data to display   EKG  Not indicated   RADIOLOGY  Image and radiology report reviewed and interpreted by me. Radiology report consistent with the same.  Image of the lumbar spines shows  transitional lumbosacral anatomy and unchanged mild lower lumbar spondylosis in comparison to CT in 2022  PROCEDURES:  Critical Care performed: No  Procedures   MEDICATIONS ORDERED IN ED:  Medications  dexamethasone (DECADRON) injection 10 mg (10 mg Intramuscular Given 12/30/23 1248)     IMPRESSION / MDM / ASSESSMENT AND PLAN / ED COURSE   I have reviewed the triage note.  Differential diagnosis includes, but is not limited to, sciatica, muscle strain, compression fracture.  Patient's presentation is most consistent with acute illness / injury with system symptoms.  38 year old male presenting to the emergency department for treatment and evaluation of persistent left side sciatica.  See HPI for further details.  Patient states that he will be traveling in the upcoming week and would like to try and get some symptom relief before hand.  Imaging of the lumbar spine obtained due to persistent pain and he has not had imaging that I can see based on chart review.  Plan will be to give him an injection  of Decadron as well.  On exam, he does have pain and symptoms with straight leg raise on the left side.  Vital signs reviewed and he is noted to be markedly hypertensive.  Patient states that he has been out of his medications for quite some time and does not currently have a primary care provider.  He is asymptomatic in relation to the blood pressure and states that it also may be high due to pain.  Plan will be to prescribe his olmesartan with 3 refills and referral to primary care for uncontrolled hypertension.  Imaging is negative for acute concerns.  He will be discharged home with prescription for prednisone, pain medication, and refills of his olmesartan and colchicine.  Medication teaching was completed.  He was encouraged to follow-up with the primary care provider as soon as they contact him with the name of someone that is taking new patients.  In the meantime, if he has any  concerning symptoms such as chest pain, shortness of breath, blurred vision, headache, weakness, or dizziness he is to return to the emergency department.      FINAL CLINICAL IMPRESSION(S) / ED DIAGNOSES   Final diagnoses:  Sciatica of left side  Uncontrolled hypertension  Medication refill     Rx / DC Orders   ED Discharge Orders          Ordered    olmesartan (BENICAR) 20 MG tablet  Daily,   Status:  Discontinued        12/30/23 1404    colchicine 0.6 MG tablet  2 times daily        12/30/23 1404    predniSONE (STERAPRED UNI-PAK 21 TAB) 10 MG (21) TBPK tablet        12/30/23 1404    oxyCODONE-acetaminophen (PERCOCET) 7.5-325 MG tablet  Every 4 hours PRN        12/30/23 1404    Ambulatory Referral to Primary Care (Establish Care)        12/30/23 1404    olmesartan (BENICAR) 20 MG tablet  Daily        12/30/23 1414             Note:  This document was prepared using Dragon voice recognition software and may include unintentional dictation errors.   Chinita Pester, FNP 01/01/24 1439    Chesley Noon, MD 01/01/24 2259

## 2024-05-07 ENCOUNTER — Other Ambulatory Visit (HOSPITAL_COMMUNITY): Payer: Self-pay

## 2024-05-07 ENCOUNTER — Encounter (HOSPITAL_COMMUNITY): Payer: Self-pay

## 2024-05-07 ENCOUNTER — Ambulatory Visit (HOSPITAL_COMMUNITY)
Admission: EM | Admit: 2024-05-07 | Discharge: 2024-05-07 | Disposition: A | Discharge status: Home / Self Care | Payer: Self-pay | Attending: Internal Medicine | Admitting: Internal Medicine

## 2024-05-07 DIAGNOSIS — T23261A Burn of second degree of back of right hand, initial encounter: Secondary | ICD-10-CM

## 2024-05-07 DIAGNOSIS — I10 Essential (primary) hypertension: Secondary | ICD-10-CM

## 2024-05-07 DIAGNOSIS — Z23 Encounter for immunization: Secondary | ICD-10-CM

## 2024-05-07 DIAGNOSIS — T22212A Burn of second degree of left forearm, initial encounter: Secondary | ICD-10-CM

## 2024-05-07 DIAGNOSIS — T23262A Burn of second degree of back of left hand, initial encounter: Secondary | ICD-10-CM

## 2024-05-07 MED ORDER — OLMESARTAN MEDOXOMIL 20 MG PO TABS
20.0000 mg | ORAL_TABLET | Freq: Every day | ORAL | 0 refills | Status: AC
  Filled 2024-05-07: qty 30, 30d supply, fill #0

## 2024-05-07 MED ORDER — BACITRACIN ZINC 500 UNIT/GM EX OINT
TOPICAL_OINTMENT | Freq: Once | CUTANEOUS | Status: AC
Start: 2024-05-07 — End: 2024-05-07
  Administered 2024-05-07: 1 via TOPICAL

## 2024-05-07 MED ORDER — MUPIROCIN 2 % EX OINT
1.0000 | TOPICAL_OINTMENT | Freq: Two times a day (BID) | CUTANEOUS | 0 refills | Status: AC
  Filled 2024-05-07: qty 60, 30d supply, fill #0
  Filled 2024-05-07: qty 66, 33d supply, fill #0

## 2024-05-07 MED ORDER — TETANUS-DIPHTH-ACELL PERTUSSIS 5-2.5-18.5 LF-MCG/0.5 IM SUSY
PREFILLED_SYRINGE | INTRAMUSCULAR | Status: AC
  Filled 2024-05-07: qty 0.5

## 2024-05-07 MED ORDER — OLMESARTAN MEDOXOMIL 20 MG PO TABS: 20.0000 mg | ORAL_TABLET | Freq: Every day | ORAL | 0 refills | Status: DC

## 2024-05-07 MED ORDER — TETANUS-DIPHTH-ACELL PERTUSSIS 5-2.5-18.5 LF-MCG/0.5 IM SUSY
0.5000 mL | PREFILLED_SYRINGE | Freq: Once | INTRAMUSCULAR | Status: AC
  Administered 2024-05-07: 0.5 mL via INTRAMUSCULAR

## 2024-05-07 MED ORDER — MUPIROCIN 2 % EX OINT: 1.0000 | TOPICAL_OINTMENT | Freq: Two times a day (BID) | CUTANEOUS | 0 refills | Status: DC

## 2024-05-07 MED ORDER — BACITRACIN ZINC 500 UNIT/GM EX OINT
TOPICAL_OINTMENT | CUTANEOUS | Status: AC
  Filled 2024-05-07: qty 3.6
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
